# Patient Record
Sex: Male | Born: 1955
Health system: Southern US, Community
[De-identification: ages and names within clinical notes are randomized; demographics above are authoritative.]

## PROBLEM LIST (undated history)

## (undated) DIAGNOSIS — F32A Depression, unspecified: Secondary | ICD-10-CM

## (undated) DIAGNOSIS — I82409 Acute embolism and thrombosis of unspecified deep veins of unspecified lower extremity: Secondary | ICD-10-CM

## (undated) DIAGNOSIS — R7301 Impaired fasting glucose: Secondary | ICD-10-CM

## (undated) DIAGNOSIS — E785 Hyperlipidemia, unspecified: Secondary | ICD-10-CM

## (undated) DIAGNOSIS — B029 Zoster without complications: Secondary | ICD-10-CM

## (undated) DIAGNOSIS — G4700 Insomnia, unspecified: Secondary | ICD-10-CM

## (undated) DIAGNOSIS — K4090 Unilateral inguinal hernia, without obstruction or gangrene, not specified as recurrent: Secondary | ICD-10-CM

## (undated) DIAGNOSIS — N529 Male erectile dysfunction, unspecified: Secondary | ICD-10-CM

## (undated) DIAGNOSIS — I10 Essential (primary) hypertension: Secondary | ICD-10-CM

## (undated) DIAGNOSIS — K219 Gastro-esophageal reflux disease without esophagitis: Secondary | ICD-10-CM

## (undated) DIAGNOSIS — K5790 Diverticulosis of intestine, part unspecified, without perforation or abscess without bleeding: Secondary | ICD-10-CM

## (undated) DIAGNOSIS — F419 Anxiety disorder, unspecified: Secondary | ICD-10-CM

## (undated) DIAGNOSIS — F329 Major depressive disorder, single episode, unspecified: Secondary | ICD-10-CM

## (undated) HISTORY — DX: Depression, unspecified: F32.A

## (undated) HISTORY — DX: Impaired fasting glucose: R73.01

## (undated) HISTORY — DX: Zoster without complications: B02.9

## (undated) HISTORY — DX: Gastro-esophageal reflux disease without esophagitis: K21.9

## (undated) HISTORY — DX: Anxiety disorder, unspecified: F41.9

## (undated) HISTORY — DX: Major depressive disorder, single episode, unspecified: F32.9

## (undated) HISTORY — DX: Hyperlipidemia, unspecified: E78.5

## (undated) HISTORY — DX: Unilateral inguinal hernia, without obstruction or gangrene, not specified as recurrent: K40.90

## (undated) HISTORY — DX: Acute embolism and thrombosis of unspecified deep veins of unspecified lower extremity: I82.409

## (undated) HISTORY — DX: Insomnia, unspecified: G47.00

## (undated) HISTORY — DX: Diverticulosis of intestine, part unspecified, without perforation or abscess without bleeding: K57.90

## (undated) HISTORY — DX: Essential (primary) hypertension: I10

## (undated) HISTORY — DX: Male erectile dysfunction, unspecified: N52.9

---
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
## 1993-01-09 HISTORY — PX: ROTATOR CUFF REPAIR: SHX139

## 1995-01-10 DIAGNOSIS — I82409 Acute embolism and thrombosis of unspecified deep veins of unspecified lower extremity: Secondary | ICD-10-CM

## 1995-01-10 HISTORY — DX: Acute embolism and thrombosis of unspecified deep veins of unspecified lower extremity: I82.409

## 1995-01-10 HISTORY — PX: OTHER SURGICAL HISTORY: SHX169

## 2004-01-10 HISTORY — PX: HERNIA REPAIR: SHX51

## 2004-01-10 HISTORY — PX: COLONOSCOPY: SHX174

## 2006-01-09 HISTORY — PX: VASECTOMY: SHX75

## 2010-06-16 ENCOUNTER — Encounter: Payer: Self-pay | Admitting: Family Medicine

## 2010-06-23 ENCOUNTER — Ambulatory Visit: Payer: Self-pay | Admitting: Family Medicine

## 2010-07-01 ENCOUNTER — Ambulatory Visit: Payer: Self-pay | Admitting: Family Medicine

## 2010-07-04 ENCOUNTER — Encounter: Payer: Self-pay | Admitting: Family Medicine

## 2010-07-04 ENCOUNTER — Ambulatory Visit (INDEPENDENT_AMBULATORY_CARE_PROVIDER_SITE_OTHER): Payer: BC Managed Care – PPO | Admitting: Family Medicine

## 2010-07-04 DIAGNOSIS — Z7969 Long term (current) use of other immunomodulators and immunosuppressants: Secondary | ICD-10-CM | POA: Insufficient documentation

## 2010-07-04 DIAGNOSIS — Z125 Encounter for screening for malignant neoplasm of prostate: Secondary | ICD-10-CM

## 2010-07-04 DIAGNOSIS — E785 Hyperlipidemia, unspecified: Secondary | ICD-10-CM | POA: Insufficient documentation

## 2010-07-04 DIAGNOSIS — Z79899 Other long term (current) drug therapy: Secondary | ICD-10-CM

## 2010-07-04 DIAGNOSIS — I10 Essential (primary) hypertension: Secondary | ICD-10-CM

## 2010-07-04 LAB — COMPREHENSIVE METABOLIC PANEL
AST: 25 U/L (ref 0–37)
Alkaline Phosphatase: 69 U/L (ref 39–117)
BUN: 18 mg/dL (ref 6–23)
Calcium: 9.5 mg/dL (ref 8.4–10.5)
Chloride: 103 mEq/L (ref 96–112)
Creat: 1.01 mg/dL (ref 0.50–1.35)

## 2010-07-04 LAB — LIPID PANEL
HDL: 36 mg/dL — ABNORMAL LOW (ref >39)
Total CHOL/HDL Ratio: 5.4 Ratio
Triglycerides: 140 mg/dL (ref <150)

## 2010-07-04 LAB — PSA: PSA: 0.64 ng/mL (ref <=4.00)

## 2010-07-04 MED ORDER — SIMVASTATIN 20 MG PO TABS: 20.0000 mg | ORAL_TABLET | Freq: Every day | ORAL | Status: DC

## 2010-07-04 MED ORDER — ENALAPRIL MALEATE 20 MG PO TABS: 20.0000 mg | ORAL_TABLET | Freq: Every day | ORAL | Status: DC

## 2010-07-04 MED ORDER — ATOVAQUONE-PROGUANIL HCL 250-100 MG PO TABS: 1.0000 | ORAL_TABLET | Freq: Every day | ORAL | Status: DC

## 2010-07-04 MED ORDER — ESOMEPRAZOLE MAGNESIUM 40 MG PO CPDR: 40.0000 mg | DELAYED_RELEASE_CAPSULE | Freq: Every day | ORAL | Status: DC

## 2010-07-04 NOTE — Assessment & Plan Note (Addendum)
Last lipid panel 05/2009: LDL 123, HDL 43, trig 258, tchol 218). Will check this today, continue statin as current or titrate as appropriate based on lab results today.  CMET today as well.

## 2010-07-04 NOTE — Assessment & Plan Note (Signed)
PSA today (last PSA 05/2009 was 0.53).

## 2010-07-04 NOTE — Progress Notes (Signed)
Office Note 07/04/2010  CC:  Chief Complaint  Patient presents with  . Establish Care    new patient    HPI:  Kyle Bryant is a 55 y.o. White male who is here to establish care. Patient's most recent primary MD: Henri Medal, MD. Old records were reviewed prior to or during today's visit.  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  However, a nurse in [place] recently told him the malaria there is a different strain and she recommended atovaquone-proguanil daily for prophylaxis.  He asks what my recommendation is.  Says BP has been under good control lately, esp after his MD in [place] increased his enalapril from 10mg  to 20mg  daily. Last labs 05/2009 showed triglycerides in the 250s, otherwise lipid panel and BMET normal, PSA normal. He does want labs today and is fasting.  Past Medical History  Diagnosis Date  . Hyperlipidemia   . GERD (gastroesophageal reflux disease)   . Anxiety   . Hypertension   . Shingles     ?    Past Surgical History  Procedure Date  . Rotator cuff repair 1995    right shoulder  . Torn menincus 1997    left knee  . Hernia repair 2006    umbillical  . Vasectomy 2008    Family History  Problem Relation Age of Onset  . Hypertension Father   . Cancer Father   . Diabetes Father     type 2  . Birth defects Sister   . Cancer Sister     breast- survivor  . Other Sister     blood clot  . Diabetes Brother     type 2    History   Social History  . Marital Status: Married    Spouse Name: N/A    Number of Children: N/A  . Years of Education: N/A   Occupational History  . Not on file.   Social History Main Topics  . Smoking status: Former Smoker -- 2.0 packs/day    Types:  Cigarettes, Pipe, Cigars    Quit date: 01/09/1990  . Smokeless tobacco: Former Neurosurgeon    Types: Snuff, Chew    Quit date: 01/09/1978  . Alcohol Use: Yes     1 pint weekly  . Drug Use: No  . Sexually Active: Yes -- Male partner(s)   Other Topics Concern  . Not on file   Social History Narrative   Married, daughter in college.  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  Walks 5[place] one day per week.Occasional drink of ETOH.    Outpatient Encounter Prescriptions as of 07/04/2010  Medication Sig Dispense Refill  . aspirin 81 MG tablet Take 81 mg by mouth daily.        Marland Kitchen esomeprazole (NEXIUM) 40 MG capsule Take 1 capsule (40 mg total) by mouth daily before breakfast.  90 capsule  2  . simvastatin (ZOCOR) 20 MG tablet Take 1 tablet (20 mg total) by mouth at bedtime.  90 tablet  2  . DISCONTD: ALLOPURINOL PO Take 20 mg by mouth daily.        Marland Kitchen DISCONTD: esomeprazole (NEXIUM) 40 MG capsule Take 40 mg by mouth daily before breakfast.        .  DISCONTD: simvastatin (ZOCOR) 20 MG tablet Take 20 mg by mouth at bedtime.        Marland Kitchen atovaquone-proguanil (MALARONE) 250-100 MG TABS Take 1 tablet by mouth daily.  90 tablet  2  . enalapril (VASOTEC) 20 MG tablet Take 1 tablet (20 mg total) by mouth daily.  90 tablet  2    No Known Allergies  ROS Review of Systems  Constitutional: Negative for fever, chills, appetite change and fatigue.  HENT: Negative for ear pain, congestion, sore throat, neck stiffness and dental problem.   Eyes: Negative for discharge, redness and visual disturbance.  Respiratory: Negative for cough, chest tightness, shortness of breath and wheezing.   Cardiovascular: Negative for chest pain, palpitations and leg swelling.  Gastrointestinal: Negative for nausea, vomiting, abdominal pain, diarrhea and blood in stool.  Genitourinary: Negative for dysuria, urgency, frequency, hematuria, flank pain and difficulty urinating.    Musculoskeletal: Positive for arthralgias (left shoulder pain--says it feels like the right shoulder did when he had rotator cuff problems.). Negative for myalgias, back pain and joint swelling.  Skin: Negative for pallor and rash.  Neurological: Negative for dizziness, speech difficulty, weakness and headaches.  Hematological: Negative for adenopathy. Does not bruise/bleed easily.  Psychiatric/Behavioral: Negative for confusion and sleep disturbance. The patient is not nervous/anxious.     PE; Blood pressure 176/98, pulse 77, temperature 98.1 F (36.7 C), temperature source Oral, height 5' 6.25" (1.683 m), weight 256 lb (116.121 kg), SpO2 96.00%. Gen: Alert, well appearing.  Patient is oriented to person, place, time, and situation. HEENT: Scalp without lesions or hair loss.  Ears: EACs clear, normal epithelium.  TMs with good light reflex and landmarks bilaterally.  Eyes: no injection, icteris, swelling, or exudate.  EOMI, PERRLA. Nose: no drainage or turbinate edema/swelling.  No injection or focal lesion.  Mouth: lips without lesion/swelling.  Oral mucosa pink and moist.  Dentition intact and without obvious caries or gingival swelling.  Oropharynx without erythema, exudate, or swelling.  Neck: supple, ROM full.  Carotids 2+ bilat, without bruit.  No lymphadenopathy, thyromegaly, or mass. Chest: symmetric expansion, nonlabored respirations.  Clear and equal breath sounds in all lung fields.   CV: RRR, no m/r/g.  Peripheral pulses 2+ and symmetric. ABD: soft, NT, ND, BS normal.  No hepatospenomegaly or mass.  No bruits. EXT: no clubbing, cyanosis, or edema.   Pertinent labs:  none  ASSESSMENT AND PLAN:  New pt today:  Chemoprophylaxis Recommended atovaquone-proguanil 250/100, 1 qd starting now (he leaves tomorrow for [place]) and continue for 1 week after return.   HTN (hypertension), benign BP up today, but pt admits to being very keyed up about his upcoming trip.  Control has been  good up to today per his report. Continue enalapril 20mg  qd.  Check lytes/cr today.  Hyperlipidemia Last lipid panel 05/2009: LDL 123, HDL 43, trig 258, tchol 218). Will check this today, continue statin as current or titrate as appropriate based on lab results today.  CMET today as well.  Prostate cancer screening PSA today (last PSA 05/2009 was 0.53).       Return in about 6 months (around 01/03/2011) for f/u HTN.

## 2010-07-04 NOTE — Assessment & Plan Note (Signed)
Recommended atovaquone-proguanil 250/100, 1 qd starting now (he leaves tomorrow for [place]) and continue for 1 week after return.

## 2010-07-04 NOTE — Assessment & Plan Note (Signed)
BP up today, but pt admits to being very keyed up about his upcoming trip.  Control has been good up to today per his report. Continue enalapril 20mg  qd.  Check lytes/cr today.

## 2010-07-05 ENCOUNTER — Telehealth: Payer: Self-pay | Admitting: Family Medicine

## 2010-07-05 NOTE — Telephone Encounter (Signed)
Attempted to call patient at all 3 of the numbers listed in his demographics.  All 3 numbers either disconnected or "cannot be connected as dialed". I was going to tell him that all his labs were normal and he should continue current dosing of all meds.  I was also going to let him know that I talked to an MD at the [facility], [facility], [facility] about taking malaria prophylaxis long term and he said that there is absolutely NO LIMIT to the duration recommended for taking prophylaxis meds for malaria.  As long as he is in a country where prophylaxis is recommended, he should take the recommended med, even if that is "years and years".

## 2010-07-05 NOTE — Telephone Encounter (Signed)
Called number given in DPR and spoke to pt regarding labs and generic Malarone.  Also spoke to pt regarding Nexium needing prior authorization.  Pt states he has taken pantoprazole, lansoprazole and omeprazole and failed all of the above.  PA for Nexium started.

## 2010-12-21 ENCOUNTER — Ambulatory Visit (INDEPENDENT_AMBULATORY_CARE_PROVIDER_SITE_OTHER): Payer: BC Managed Care – PPO | Admitting: Family Medicine

## 2010-12-21 ENCOUNTER — Encounter: Payer: Self-pay | Admitting: Family Medicine

## 2010-12-21 DIAGNOSIS — E785 Hyperlipidemia, unspecified: Secondary | ICD-10-CM

## 2010-12-21 DIAGNOSIS — I10 Essential (primary) hypertension: Secondary | ICD-10-CM

## 2010-12-21 NOTE — Progress Notes (Signed)
OFFICE NOTE  12/21/2010  CC:  Chief Complaint  Patient presents with  . Hypertension    6 month follow up     HPI:   Patient is a 55 y.o. Caucasian male who is here for 90mo f/u HTN and hyperlipidemia. No acute complaints. Had ILI about 2 wks ago, then got flu vaccine. He is compliant with meds but doesn't watch his diet at all and struggles to exercise even a marginal amount. 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 ROS: occ hips, knees, ankles, hands pain--usually random, lasts 1/2 day to 1 day, no redness or swelling of joints. Describes mild intermittent CTS in L>R hands--has tried wrist splints in the past and then just decided to "deal with it".  Pertinent PMH:  Past Medical History  Diagnosis Date  . Hyperlipidemia   . GERD (gastroesophageal reflux disease)   . Anxiety   . Hypertension   . Shingles     ?   Past surgical, family and social history reviewed and there are no changes since the patient's last office visit with me.  MEDS;   Outpatient Prescriptions Prior to Visit  Medication Sig Dispense Refill  . aspirin 81 MG tablet Take 81 mg by mouth daily.        . enalapril (VASOTEC) 20 MG tablet Take 1 tablet (20 mg total) by mouth daily.  90 tablet  2  . esomeprazole (NEXIUM) 40 MG capsule Take 1 capsule (40 mg total) by mouth daily before breakfast.  90 capsule  2  . simvastatin (ZOCOR) 20 MG tablet Take 1 tablet (20 mg total) by mouth at bedtime.  90 tablet  2  . atovaquone-proguanil (MALARONE) 250-100 MG TABS Take 1 tablet by mouth daily.  90 tablet  2   ROS: as per HPI  PE: Blood pressure 118/76, pulse 80, height 5' 6.25" (1.683 m), weight 268 lb (121.564 kg). Repeat bp after in exam room 15 min was 118/76.    Gen: Alert, well appearing, obese-appearing.  Patient is oriented to person, place, time, and situation.  Pleasant affect.   ENT: PERRLA, EOMI, no icterus Neck - No masses or thyromegaly or limitation in range of motion CV: RRR, no  m/r/g.   LUNGS: CTA bilat, nonlabored resps, good aeration in all lung fields. Musculoskeletal: no joint swelling, erythema, warmth, or tenderness.  ROM of all joints intact.   IMPRESSION AND PLAN:  HTN (hypertension), benign Problem stable.  Continue current medications and diet appropriate for this condition.  We have reviewed our general long term plan for this problem and also reviewed symptoms and signs that should prompt the patient to call or return to the office. cmet today.  Hyperlipidemia Routine cholesterol panel 90mo ago was basically at goal.  He is not fasting today.  We discussed HDL labs today and he is agreeable to getting these done today.       FOLLOW UP:  No Follow-up on file.

## 2010-12-21 NOTE — Assessment & Plan Note (Signed)
Problem stable.  Continue current medications and diet appropriate for this condition.  We have reviewed our general long term plan for this problem and also reviewed symptoms and signs that should prompt the patient to call or return to the office. cmet today.

## 2010-12-21 NOTE — Assessment & Plan Note (Signed)
Routine cholesterol panel 33mo ago was basically at goal.  He is not fasting today.  We discussed HDL labs today and he is agreeable to getting these done today.

## 2011-01-04 ENCOUNTER — Encounter: Payer: Self-pay | Admitting: Family Medicine

## 2011-01-06 ENCOUNTER — Telehealth: Payer: Self-pay | Admitting: Family Medicine

## 2011-01-06 NOTE — Telephone Encounter (Signed)
Pls notify pt that his advanced lipid testing did show that he has some excess cardiovascular risk.  Before I change anything, though, I really need him to come back and let me repeat one of the tests b/c it was really elevated and I want to verify this.  (his hs-CRP was 12.2 and 3 is th upper limit of normal).  Thx--PM

## 2011-01-11 NOTE — Telephone Encounter (Signed)
Pt agreeable with plan.  He has been/is out of town.  Appt scheduled for Monday.  Please enter order.

## 2011-01-12 ENCOUNTER — Other Ambulatory Visit: Payer: Self-pay | Admitting: Family Medicine

## 2011-01-12 DIAGNOSIS — R7982 Elevated C-reactive protein (CRP): Secondary | ICD-10-CM

## 2011-01-12 NOTE — Telephone Encounter (Signed)
crp ordered--PM

## 2011-01-16 ENCOUNTER — Other Ambulatory Visit (INDEPENDENT_AMBULATORY_CARE_PROVIDER_SITE_OTHER): Payer: BC Managed Care – PPO

## 2011-01-16 DIAGNOSIS — R7982 Elevated C-reactive protein (CRP): Secondary | ICD-10-CM

## 2011-01-16 LAB — HIGH SENSITIVITY CRP: CRP, High Sensitivity: 2.51 mg/L (ref 0.000–5.000)

## 2011-01-17 ENCOUNTER — Encounter: Payer: Self-pay | Admitting: Family Medicine

## 2011-01-17 ENCOUNTER — Other Ambulatory Visit: Payer: Self-pay | Admitting: Family Medicine

## 2011-01-17 MED ORDER — SIMVASTATIN 40 MG PO TABS: 40.0000 mg | ORAL_TABLET | Freq: Every day | ORAL | Status: DC

## 2011-01-30 ENCOUNTER — Encounter: Payer: Self-pay | Admitting: Family Medicine

## 2011-02-06 ENCOUNTER — Ambulatory Visit: Payer: BC Managed Care – PPO | Admitting: Family Medicine

## 2011-02-09 ENCOUNTER — Ambulatory Visit (INDEPENDENT_AMBULATORY_CARE_PROVIDER_SITE_OTHER): Payer: BC Managed Care – PPO | Admitting: Family Medicine

## 2011-02-09 ENCOUNTER — Encounter: Payer: Self-pay | Admitting: Family Medicine

## 2011-02-09 VITALS — BP 143/77 | HR 76 | Temp 97.0°F | Ht 66.25 in | Wt 272.0 lb

## 2011-02-09 DIAGNOSIS — E785 Hyperlipidemia, unspecified: Secondary | ICD-10-CM

## 2011-02-09 NOTE — Progress Notes (Signed)
OFFICE NOTE  02/09/2011  CC:  Chief Complaint  Patient presents with  . Hyperlipidemia    discuss HDL labs     HPI: Patient is a 56 y.o. Caucasian male who is here for discussion of his hyperlipidemia, treatments, labs, etc. He feels well and denies any acute physical complaints. We spent 15 min discussing the results of his HDL labs that were done approximately 6 wks ago.  After these results returned, I advised him to stop his fish oil, increase exercise and increase his zocor to 40mg  qd.   Pertinent PMH:  HTN Hyperlipidemia Obesity  MEDS:  Outpatient Prescriptions Prior to Visit  Medication Sig Dispense Refill  . Ascorbic Acid (VITAMIN C) 1000 MG tablet Take 1,000 mg by mouth daily.        Marland Kitchen aspirin 81 MG tablet Take 81 mg by mouth daily.        . enalapril (VASOTEC) 20 MG tablet Take 1 tablet (20 mg total) by mouth daily.  90 tablet  2  . esomeprazole (NEXIUM) 40 MG capsule Take 1 capsule (40 mg total) by mouth daily before breakfast.  90 capsule  2  . Multiple Vitamin (MULTIVITAMIN) tablet Take 1 tablet by mouth daily.        . simvastatin (ZOCOR) 40 MG tablet Take 1 tablet (40 mg total) by mouth at bedtime.  90 tablet  0    PE: Blood pressure 143/77, pulse 76, temperature 97 F (36.1 C), temperature source Temporal, height 5' 6.25" (1.683 m), weight 272 lb (123.378 kg). Gen: Alert, well appearing.  Patient is oriented to person, place, time, and situation. Pleasant affect, lucid thought and speech.  No further exam today.  IMPRESSION AND PLAN: Hyperlipidemia: we reviewed his advanced lipid testing from 12/2010 and his cholesterol panel from 06/2010. We went over the reasoning behind my recent recommendations to stop fish oil and increase zocor. He expressed understanding and asked questions. I spent 15 min with pt today, with >50% of this time spent counseling regarding high cholesterol and it's associated CV risk, importance of diet, maintenance of statin regimen,  periodic follow up testing, possible need for additional meds in the future, etc.   Plan is to recheck fasting lipids ASAP (he has been 6 wks on the new regimen now), with goal LDL being <100 and goal HDL being >50, goal trigs <161.  Will also repeat hsCRP, which had been very elevated around the time of an ILI, and then retesting later when well was intermediate range for CV risk cut-off (about 2.5).  FOLLOW UP: 68mo

## 2011-02-14 ENCOUNTER — Other Ambulatory Visit (INDEPENDENT_AMBULATORY_CARE_PROVIDER_SITE_OTHER): Payer: BC Managed Care – PPO

## 2011-02-14 DIAGNOSIS — E785 Hyperlipidemia, unspecified: Secondary | ICD-10-CM

## 2011-02-14 LAB — LIPID PANEL
HDL: 35 mg/dL — ABNORMAL LOW (ref >39.00)
Total CHOL/HDL Ratio: 5
Triglycerides: 109 mg/dL (ref 0.0–149.0)

## 2011-02-17 ENCOUNTER — Other Ambulatory Visit: Payer: Self-pay | Admitting: Family Medicine

## 2011-02-17 MED ORDER — NIACIN ER (ANTIHYPERLIPIDEMIC) 500 MG PO TBCR: 500.0000 mg | EXTENDED_RELEASE_TABLET | Freq: Every day | ORAL | Status: DC

## 2011-02-17 NOTE — Progress Notes (Signed)
RX sent as per last lab result note.

## 2011-03-28 ENCOUNTER — Other Ambulatory Visit: Payer: Self-pay | Admitting: *Deleted

## 2011-03-28 MED ORDER — ESOMEPRAZOLE MAGNESIUM 40 MG PO CPDR: 40.0000 mg | DELAYED_RELEASE_CAPSULE | Freq: Every day | ORAL | Status: DC

## 2011-03-28 NOTE — Telephone Encounter (Signed)
eScribe request for refill on nexium Last seen on 12/21/10 Follow up needed June 2013 Last filled 07/04/10, 90 x2 RX sent

## 2011-03-30 ENCOUNTER — Other Ambulatory Visit: Payer: Self-pay | Admitting: *Deleted

## 2011-03-30 MED ORDER — SIMVASTATIN 40 MG PO TABS: 40.0000 mg | ORAL_TABLET | Freq: Every day | ORAL | Status: DC

## 2011-03-30 MED ORDER — ENALAPRIL MALEATE 20 MG PO TABS: 20.0000 mg | ORAL_TABLET | Freq: Every day | ORAL | Status: DC

## 2011-03-30 MED ORDER — ESOMEPRAZOLE MAGNESIUM 40 MG PO CPDR: 40.0000 mg | DELAYED_RELEASE_CAPSULE | Freq: Every day | ORAL | Status: DC

## 2011-03-30 NOTE — Telephone Encounter (Signed)
PC from [place], stating pt will be going out of the country and will need 180 day supply of enalapril, nexium and simvastatin.  180 day supply sent.  Pt will need follow up OV when he returns.

## 2011-04-03 ENCOUNTER — Telehealth: Payer: Self-pay | Admitting: *Deleted

## 2011-04-03 MED ORDER — ENALAPRIL MALEATE 20 MG PO TABS: 20.0000 mg | ORAL_TABLET | Freq: Every day | ORAL | Status: DC

## 2011-04-03 MED ORDER — SIMVASTATIN 40 MG PO TABS: 40.0000 mg | ORAL_TABLET | Freq: Every day | ORAL | Status: DC

## 2011-04-03 MED ORDER — ESOMEPRAZOLE MAGNESIUM 40 MG PO CPDR: 40.0000 mg | DELAYED_RELEASE_CAPSULE | Freq: Every day | ORAL | Status: DC

## 2011-04-03 NOTE — Telephone Encounter (Signed)
Insurance will not cover 180 day.  They will only cover 90 day.  90 day supply sent to pharmacy.  All other scripts with 30 or 90 day quantities are cancelled per [facility] at [facility].

## 2011-06-22 ENCOUNTER — Ambulatory Visit: Payer: BC Managed Care – PPO | Admitting: Family Medicine

## 2011-07-20 ENCOUNTER — Ambulatory Visit (INDEPENDENT_AMBULATORY_CARE_PROVIDER_SITE_OTHER): Payer: Managed Care, Other (non HMO) | Admitting: Family Medicine

## 2011-07-20 ENCOUNTER — Encounter: Payer: Self-pay | Admitting: Family Medicine

## 2011-07-20 VITALS — BP 156/83 | HR 71 | Ht 66.25 in | Wt 258.0 lb

## 2011-07-20 DIAGNOSIS — E785 Hyperlipidemia, unspecified: Secondary | ICD-10-CM

## 2011-07-20 DIAGNOSIS — I10 Essential (primary) hypertension: Secondary | ICD-10-CM

## 2011-07-20 DIAGNOSIS — G4700 Insomnia, unspecified: Secondary | ICD-10-CM

## 2011-07-20 MED ORDER — ZOLPIDEM TARTRATE 10 MG PO TABS: 10.0000 mg | ORAL_TABLET | Freq: Every evening | ORAL | Status: DC | PRN

## 2011-07-20 MED ORDER — ENALAPRIL MALEATE 20 MG PO TABS: 20.0000 mg | ORAL_TABLET | Freq: Every day | ORAL | Status: DC

## 2011-07-20 MED ORDER — ATORVASTATIN CALCIUM 10 MG PO TABS: 10.0000 mg | ORAL_TABLET | Freq: Every day | ORAL | Status: DC

## 2011-07-20 NOTE — Progress Notes (Signed)
OFFICE VISIT  07/20/2011   CC:  Chief Complaint  Patient presents with  . Follow-up    HTN, Hyperlipidemia-not taking meds     HPI:    Patient is a 56 y.o. Caucasian male who presents for 6 mo f/u for HTN and hyperlipidemia. Here between jobs out of country--[place].   Describes very strong FH of hyperlipidemia, says none of them have been on cholesterol med and they all seem to be fine and live long lives.  He has made some good dietary changes and increased his exercise over the last few months (14 lb wt loss).  He stopped his zocor b/c it consistently caused anal leakage from the time he started it.  He recalls no prior cholesterol med. He did not ever start the niaspan I recommended after last lipid/HDL f/u testing.  He wanted to see what I thought about him not taking any meds for cholesterol problem, esp given the above info.  He also describes a long hx of maintenance insomnia.  Describes good sleep hygiene, initiates sleep fine, usually wakes up 3-4 hours later and "my mind won't shut down--thinking about my job, etc".  No depressed mood.  No RLS sx's.  Has tried [place] in the past and it helped on a prn basis, without causing any hangover effect.   Past Medical History  Diagnosis Date  . Hyperlipidemia     HDL labs 12/2010: Apolipoprotein E genotype 3/4, which indicates that taking fish oil supplements may INCREASE his LDL significantly.  Marland Kitchen GERD (gastroesophageal reflux disease)   . Anxiety   . Hypertension   . Shingles     ?    Past Surgical History  Procedure Date  . Rotator cuff repair 1995    right shoulder  . Torn menincus 1997    left knee  . Hernia repair 2006    umbillical  . Vasectomy 2008  . Colonoscopy 2006    [place]    Outpatient Prescriptions Prior to Visit  Medication Sig Dispense Refill  . Ascorbic Acid (VITAMIN C) 1000 MG tablet Take 1,000 mg by mouth daily.        Marland Kitchen aspirin 81 MG  tablet Take 81 mg by mouth daily.        Marland Kitchen esomeprazole (NEXIUM) 40 MG capsule Take 1 capsule (40 mg total) by mouth daily before breakfast.  90 capsule  1  . Multiple Vitamin (MULTIVITAMIN) tablet Take 1 tablet by mouth daily.        . enalapril (VASOTEC) 20 MG tablet Take 1 tablet (20 mg total) by mouth daily.  90 tablet  1  . niacin (NIASPAN) 500 MG CR tablet Take 1 tablet (500 mg total) by mouth at bedtime.  90 tablet  1  . simvastatin (ZOCOR) 40 MG tablet Take 1 tablet (40 mg total) by mouth at bedtime.  90 tablet  1    No Known Allergies  ROS As per HPI  PE: Blood pressure 156/83, pulse 71, height 5' 6.25" (1.683 m), weight 258 lb (117.028 kg). Gen: Alert, well appearing.  Patient is oriented to person, place, time, and situation. No further exam today.  LABS:  None today  IMPRESSION AND PLAN:  Hyperlipidemia Discussed approaches to treatment with him today in detail. It is encouraging to see his lifestyle changes helping him lose wt, and hopefully this will be seen with his lipids as well. However, I did recommend  he stay on statin medication but we'll stay away from zocor due to "anal leakage" side effect.  We'll try atorvastatin 10mg  qd and hopefully it will not cause any similar side effect.  Reassured pt regarding long term use of this type of med, reviewed minimal risk of liver dysfunction or muscle inflammation. He agreed to proceed with this plan and we'll f/u FLP in 75mo when he is back in the country.  HTN (hypertension), benign Last 2 readings here in Stage 1 HTN range, but I emphasized the need for [place] to have bp measurements regularly OUTSIDE of our office. He agreed to buy a bp cuff and keep record of bps and bring these in to next f/u visit.  No med changes made today.  Middle insomnia Encouraged him to continue his efforts at doing good sleep hygiene. Gave rx for ambien 10mg  to take hs prn, #30, RF x 1.  Therapeutic expectations and side effect profile of  medication discussed today.  Patient's questions answered.    Spent 25 min with pt today, with>50% of this time spent in counseling regarding the above problems.  FOLLOW UP: Return for 5-6 mo for f/u hyperlip and HTN (needs to be fasting).

## 2011-07-20 NOTE — Assessment & Plan Note (Signed)
Discussed approaches to treatment with him today in detail. It is encouraging to see his lifestyle changes helping him lose wt, and hopefully this will be seen with his lipids as well. However, I did recommend he stay on statin medication but we'll stay away from zocor due to "anal leakage" side effect.  We'll try atorvastatin 10mg  qd and hopefully it will not cause any similar side effect.  Reassured pt regarding long term use of this type of med, reviewed minimal risk of liver dysfunction or muscle inflammation. He agreed to proceed with this plan and we'll f/u FLP in 47mo when he is back in the country.

## 2011-07-20 NOTE — Assessment & Plan Note (Signed)
Encouraged him to continue his efforts at doing good sleep hygiene. Gave rx for ambien 10mg  to take hs prn, #30, RF x 1.  Therapeutic expectations and side effect profile of medication discussed today.  Patient's questions answered.

## 2011-07-20 NOTE — Assessment & Plan Note (Signed)
Last 2 readings here in Stage 1 HTN range, but I emphasized the need for [place] to have bp measurements regularly OUTSIDE of our office. He agreed to buy a bp cuff and keep record of bps and bring these in to next f/u visit.  No med changes made today.

## 2011-10-23 ENCOUNTER — Other Ambulatory Visit: Payer: Self-pay

## 2011-10-23 MED ORDER — ESOMEPRAZOLE MAGNESIUM 40 MG PO CPDR: 40.0000 mg | DELAYED_RELEASE_CAPSULE | Freq: Every day | ORAL | Status: DC

## 2011-10-23 NOTE — Telephone Encounter (Signed)
RX sent to pharmacy  

## 2011-12-20 ENCOUNTER — Other Ambulatory Visit: Payer: Self-pay | Admitting: Family Medicine

## 2011-12-21 NOTE — Telephone Encounter (Signed)
eScribe request for refill on SIMVASTATIN, ENALAPRIL Last filled - 04/03/11, #90 X 1 Last seen on - 07/20/11 Follow up - 01/15/12  RX sent per protocol

## 2012-01-15 ENCOUNTER — Ambulatory Visit: Payer: Managed Care, Other (non HMO) | Admitting: Family Medicine

## 2012-04-22 ENCOUNTER — Other Ambulatory Visit: Payer: Self-pay | Admitting: Family Medicine

## 2012-04-22 NOTE — Telephone Encounter (Signed)
RX sent on 12/20/11 for #90 x 1.  Per [facility] at pharmacy they have this RX and will fill.  This request denied.

## 2012-06-07 ENCOUNTER — Encounter: Payer: Self-pay | Admitting: Family Medicine

## 2012-06-07 ENCOUNTER — Ambulatory Visit (INDEPENDENT_AMBULATORY_CARE_PROVIDER_SITE_OTHER): Payer: Managed Care, Other (non HMO) | Admitting: Family Medicine

## 2012-06-07 VITALS — BP 126/88 | HR 65 | Temp 98.1°F | Resp 16 | Wt 269.2 lb

## 2012-06-07 DIAGNOSIS — M25519 Pain in unspecified shoulder: Secondary | ICD-10-CM

## 2012-06-07 DIAGNOSIS — I10 Essential (primary) hypertension: Secondary | ICD-10-CM

## 2012-06-07 DIAGNOSIS — G8929 Other chronic pain: Secondary | ICD-10-CM

## 2012-06-07 DIAGNOSIS — E785 Hyperlipidemia, unspecified: Secondary | ICD-10-CM

## 2012-06-07 MED ORDER — ESOMEPRAZOLE MAGNESIUM 40 MG PO CPDR: 40.0000 mg | DELAYED_RELEASE_CAPSULE | Freq: Every day | ORAL | Status: DC

## 2012-06-07 MED ORDER — ZOLPIDEM TARTRATE 10 MG PO TABS: 10.0000 mg | ORAL_TABLET | Freq: Every evening | ORAL | Status: DC | PRN

## 2012-06-07 MED ORDER — ATORVASTATIN CALCIUM 10 MG PO TABS: 10.0000 mg | ORAL_TABLET | Freq: Every day | ORAL | Status: DC

## 2012-06-07 MED ORDER — ENALAPRIL MALEATE 20 MG PO TABS: ORAL_TABLET | ORAL | Status: DC

## 2012-06-07 NOTE — Progress Notes (Signed)
OFFICE NOTE  06/07/2012  CC:  Chief Complaint  Patient presents with  . Follow-up    [HTN; Hyperlipidemia; Chronic shoulder pain]; pt Not fasting.  . Medication Refill    Pt request refills for medications; Rx[s] pending.     HPI: Patient is a 57 y.o. Caucasian male who is here for 10 mo f/u HTN, hyperlipidemia. Has been in [place] since about the last time I saw him, [employer]. Just got back a couple of days ago and right before coming back says he had a bad diarrhea illness.  He was given some "russian meds" and says the last day or two he has pretty much returned to normal. He has been compliant with bp and chol meds except on sick days.    Complains of a few years of on/off left shoulder pain ever since an acute strain injury.  Last 3 mo hurting consistently more/worse.  The pain frequently wakes him up at night (dull/aching pain), worse with movements that impinge the rotator cuff. Says he had a steroid injection in the same shoulder just after his acute injury years ago but nothing since then.  Has hx of rotator cuff tear/repair on right side.  Also complains of years of lower legs being slightly swollen.  Worse at the end of the day and after prolonged standing/inactivity (like the airline flight home recently).  No acute pain.  Says he thinks the left has always been a bit worse than right.  Occ gets calf stiffness and cramping.  No acute calf or thigh pain lately.    Pertinent PMH:  Past Medical History  Diagnosis Date  . Hyperlipidemia     HDL labs 12/2010: Apolipoprotein E genotype 3/4, which indicates that taking fish oil supplements may INCREASE his LDL significantly.  Marland Kitchen GERD (gastroesophageal reflux disease)   . Anxiety   . Hypertension   . Shingles     ?  Marland Kitchen DVT (deep venous thrombosis) 1997    left leg; s/p knee surgery   Past Surgical History  Procedure Laterality Date  . Rotator cuff repair  1995    right shoulder  . Torn menincus  1997     left knee  . Hernia repair  2006    umbillical  . Vasectomy  2008  . Colonoscopy  2006    [place] of [place])   Past family and social history reviewed and there are no changes since the patient's last office visit with me.  MEDS:  Outpatient Prescriptions Prior to Visit  Medication Sig Dispense Refill  . Ascorbic Acid (VITAMIN C) 1000 MG tablet Take 1,000 mg by mouth daily.        Marland Kitchen aspirin 81 MG tablet Take 81 mg by mouth daily.        . Multiple Vitamin (MULTIVITAMIN) tablet Take 1 tablet by mouth daily.        Marland Kitchen atorvastatin (LIPITOR) 10 MG tablet Take 1 tablet (10 mg total) by mouth daily.  90 tablet  2  . enalapril (VASOTEC) 20 MG tablet TAKE 1 TABLET (20 MG TOTAL) BY MOUTH DAILY.  90 tablet  1  . esomeprazole (NEXIUM) 40 MG capsule Take 1 capsule (40 mg total) by mouth daily before breakfast.  90 capsule  0  . simvastatin (ZOCOR) 40 MG tablet TAKE 1 TABLET (40 MG TOTAL) BY MOUTH AT BEDTIME.  90 tablet  1  . zolpidem (AMBIEN) 10 MG tablet Take 1 tablet (10 mg total) by mouth at bedtime  as needed for sleep.  30 tablet  1   No facility-administered medications prior to visit.    PE: Blood pressure 126/88, pulse 65, temperature 98.1 F (36.7 C), temperature source Oral, resp. rate 16, weight 269 lb 4 oz (122.131 kg), SpO2 98.00%. Gen: Alert, well appearing.  Patient is oriented to person, place, time, and situation. CV: RRR, no m/r/g.   LUNGS: CTA bilat, nonlabored resps, good aeration in all lung fields. ABD: soft, NT, ND, BS normal.  No hepatospenomegaly or mass.  No bruits. EXT: trace pitting edema bilat in pretibial regions and ankles, with some slight varicosities scattered throughout lower legs but none are palpable. No calf tenderness, no popliteal fossa tenderness.  Right and left calf circumference each 45 cm measured 10 cm below the patella. No LE rash or skin pigment changes.   No skin flaking.  IMPRESSION AND PLAN:  1) LE venous insufficiency edema; pretty  well controlled with compression stockings that he has on today. Discussed dx in depth, stressed the importance of low Na diet.  Discussed importance of exercise/walking and also periodic elevation of legs above the level of the heart.  2) HTN: stable.  RF'd meds today.  3) Hyperlipidemia: RF'd med today. Return ASAP for FLP and CMET.  4) Chronic left shoulder pain; not worked up today but pt elected for referral to orthopedist--ordered this today.  An After Visit Summary was printed and given to the patient.  FOLLOW UP: 6mo f/u HTN, hyperlipidemia; will try to do DRE/PSA screening at that f/u as well.

## 2012-06-12 ENCOUNTER — Other Ambulatory Visit: Payer: Managed Care, Other (non HMO)

## 2012-06-20 ENCOUNTER — Other Ambulatory Visit: Payer: Self-pay | Admitting: Family Medicine

## 2012-06-20 NOTE — Telephone Encounter (Signed)
Request for [facility] & [facility]itor from [facility]; Last Rx on both 05.30.14 #90x2, verified with Jonny Ruiz at pharmacy that pt has available refills on refills-new request Denied on this basis/SLS

## 2012-12-16 ENCOUNTER — Ambulatory Visit (INDEPENDENT_AMBULATORY_CARE_PROVIDER_SITE_OTHER): Payer: Managed Care, Other (non HMO) | Admitting: Family Medicine

## 2012-12-16 ENCOUNTER — Encounter: Payer: Self-pay | Admitting: Family Medicine

## 2012-12-16 VITALS — BP 155/94 | HR 72 | Temp 98.4°F | Resp 18 | Ht 66.75 in | Wt 252.0 lb

## 2012-12-16 DIAGNOSIS — E785 Hyperlipidemia, unspecified: Secondary | ICD-10-CM

## 2012-12-16 DIAGNOSIS — I10 Essential (primary) hypertension: Secondary | ICD-10-CM

## 2012-12-16 DIAGNOSIS — F329 Major depressive disorder, single episode, unspecified: Secondary | ICD-10-CM

## 2012-12-16 DIAGNOSIS — N529 Male erectile dysfunction, unspecified: Secondary | ICD-10-CM

## 2012-12-16 LAB — MICROALBUMIN / CREATININE URINE RATIO: Creatinine,U: 214.4 mg/dL

## 2012-12-16 MED ORDER — ESCITALOPRAM OXALATE 10 MG PO TABS: 10.0000 mg | ORAL_TABLET | Freq: Every day | ORAL | Status: DC

## 2012-12-16 MED ORDER — SILDENAFIL CITRATE 100 MG PO TABS: ORAL_TABLET | ORAL | Status: DC

## 2012-12-16 NOTE — Progress Notes (Signed)
OFFICE NOTE   Pre visit review using our clinic review tool, if applicable. No additional management support is needed unless otherwise documented below in the visit note.   12/16/2012  CC:  Chief Complaint  Patient presents with  . Follow-up     HPI: Patient is a 57 y.o. Caucasian male who is here for 6 mo f/u hypertension, hyperlipidemia, GERD, and insomnia.  He has been dieting/changed eating habits and has purposefully lost 17 lbs in the last 6 mo.  Patient anxious about wife moving to [place] to live with him.  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  Admits to feeling lonely, depressed, irritable and angry a lot "for years, somewhat, but worse for the last 6 mo or so"  Also c/o erectile dysfunction for 1 yr or so.  Sexual desire intact.    He had intolerable anal leakage that was made worse by stress/work as well as his lipitor. He stopped lipitor and this got better but still he has to clean himself up 3-4 times per day.  Has had URI/cough x 10d and this is gradually improving on mucinex.  Pertinent PMH:  Past Medical History  Diagnosis Date  . Hyperlipidemia     HDL labs 12/2010: Apolipoprotein E genotype 3/4, which indicates that taking fish oil supplements may INCREASE his LDL significantly.  Marland Kitchen GERD (gastroesophageal reflux disease)   . Anxiety   . Hypertension   . Shingles     ?  Marland Kitchen DVT (deep venous thrombosis) 1997    left leg; s/p knee surgery.  Coumadin x 11mo.  . Insomnia    Past Surgical History  Procedure Laterality Date  . Rotator cuff repair  1995    right shoulder  . Torn menincus  1997    left knee  . Hernia repair  2006    umbillical  . Vasectomy  2008  . Colonoscopy  2006    Normal (out of state)   History   Social History Narrative   Married, daughter in college.     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   Lived in [place] prior to [place].   No smoker.  Walks 5mi one day per  week.   Occasional drink of ETOH.          MEDS:  Outpatient Prescriptions Prior to Visit  Medication Sig Dispense Refill  . Ascorbic Acid (VITAMIN C) 1000 MG tablet Take 1,000 mg by mouth daily.        Marland Kitchen aspirin 81 MG tablet Take 81 mg by mouth daily.        . enalapril (VASOTEC) 20 MG tablet TAKE 1 TABLET (20 MG TOTAL) BY MOUTH DAILY.  90 tablet  2  . esomeprazole (NEXIUM) 40 MG capsule Take 1 capsule (40 mg total) by mouth daily before breakfast.  90 capsule  2  . Multiple Vitamin (MULTIVITAMIN) tablet Take 1 tablet by mouth daily.        Marland Kitchen zolpidem (AMBIEN) 10 MG tablet Take 1 tablet (10 mg total) by mouth at bedtime as needed for sleep.  90 tablet  0  . atorvastatin (LIPITOR) 10 MG tablet Take 1 tablet (10 mg total) by mouth daily.  90 tablet  2   No facility-administered medications prior to visit.  **Not taking lipitor as listed above.  PE: Blood pressure 155/94, pulse 72, temperature 98.4 F (36.9 C), temperature source Temporal, resp. rate 18, height 5' 6.75" (1.695 m), weight  252 lb (114.306 kg), SpO2 98.00%. Gen: Alert, well appearing.  Patient is oriented to person, place, time, and situation. ZOX:WRUE: no injection, icteris, swelling, or exudate.  EOMI, PERRLA. Mouth: lips without lesion/swelling.  Oral mucosa pink and moist. Oropharynx without erythema, exudate, or swelling.  CV: RRR, no m/r/g.   LUNGS: CTA bilat, nonlabored resps, good aeration in all lung fields.  IMPRESSION AND PLAN:  Depression, major Start lexapro 10mg  qd.  Therapeutic expectations and side effect profile of medication discussed today.  Patient's questions answered.   Erectile dysfunction Viagra 50mg  samples, 1-2 qd prn, rx given as well.  Therapeutic expectations and side effect profile of medication discussed today.  Patient's questions answered.   HTN (hypertension), benign Check urine microalbumin/cr today. Continue vasotec 20mg  qd. Aspirin 81mg  qd.  Hyperlipidemia Lab Results   Component Value Date   CHOL 160 02/14/2011   HDL 35.00* 02/14/2011   LDLCALC 103* 02/14/2011   TRIG 109.0 02/14/2011   CHOLHDL 5 02/14/2011   Continue diet/exercise.   FOLLOW UP: 05/2013 for CPE

## 2012-12-23 ENCOUNTER — Encounter: Payer: Self-pay | Admitting: Family Medicine

## 2012-12-23 DIAGNOSIS — F329 Major depressive disorder, single episode, unspecified: Secondary | ICD-10-CM | POA: Insufficient documentation

## 2012-12-23 DIAGNOSIS — N529 Male erectile dysfunction, unspecified: Secondary | ICD-10-CM | POA: Insufficient documentation

## 2012-12-23 NOTE — Assessment & Plan Note (Signed)
Lab Results  Component Value Date   CHOL 160 02/14/2011   HDL 35.00* 02/14/2011   LDLCALC 103* 02/14/2011   TRIG 109.0 02/14/2011   CHOLHDL 5 02/14/2011   Continue diet/exercise.

## 2012-12-23 NOTE — Assessment & Plan Note (Signed)
Start lexapro 10mg  qd.  Therapeutic expectations and side effect profile of medication discussed today.  Patient's questions answered.

## 2012-12-23 NOTE — Assessment & Plan Note (Addendum)
Check urine microalbumin/cr today. Continue vasotec 20mg  qd. Aspirin 81mg  qd.

## 2012-12-23 NOTE — Assessment & Plan Note (Signed)
Viagra 50mg  samples, 1-2 qd prn, rx given as well.  Therapeutic expectations and side effect profile of medication discussed today.  Patient's questions answered.

## 2013-04-16 ENCOUNTER — Encounter: Payer: Managed Care, Other (non HMO) | Admitting: Family Medicine

## 2013-04-21 ENCOUNTER — Ambulatory Visit (INDEPENDENT_AMBULATORY_CARE_PROVIDER_SITE_OTHER): Payer: Managed Care, Other (non HMO) | Admitting: Family Medicine

## 2013-04-21 ENCOUNTER — Encounter: Payer: Self-pay | Admitting: Family Medicine

## 2013-04-21 VITALS — BP 120/74 | HR 71 | Temp 99.0°F | Resp 18 | Ht 66.75 in | Wt 245.0 lb

## 2013-04-21 DIAGNOSIS — Z0389 Encounter for observation for other suspected diseases and conditions ruled out: Secondary | ICD-10-CM

## 2013-04-21 DIAGNOSIS — Z0000 Encounter for general adult medical examination without abnormal findings: Secondary | ICD-10-CM

## 2013-04-21 LAB — COMPREHENSIVE METABOLIC PANEL
ALT: 24 U/L (ref 0–53)
AST: 21 U/L (ref 0–37)
Albumin: 3.6 g/dL (ref 3.5–5.2)
Alkaline Phosphatase: 67 U/L (ref 39–117)
BILIRUBIN TOTAL: 0.4 mg/dL (ref 0.3–1.2)
BUN: 15 mg/dL (ref 6–23)
CO2: 24 meq/L (ref 19–32)
CREATININE: 1 mg/dL (ref 0.4–1.5)
Calcium: 9.5 mg/dL (ref 8.4–10.5)
Chloride: 105 mEq/L (ref 96–112)
GFR: 80.97 mL/min (ref >60.00)
GLUCOSE: 96 mg/dL (ref 70–99)
Potassium: 4.4 mEq/L (ref 3.5–5.1)
Sodium: 139 mEq/L (ref 135–145)
TOTAL PROTEIN: 6.4 g/dL (ref 6.0–8.3)

## 2013-04-21 LAB — LIPID PANEL
CHOLESTEROL: 250 mg/dL — AB (ref 0–200)
HDL: 40.2 mg/dL (ref >39.00)
LDL CALC: 151 mg/dL — AB (ref 0–99)
TRIGLYCERIDES: 294 mg/dL — AB (ref 0.0–149.0)
Total CHOL/HDL Ratio: 6
VLDL: 58.8 mg/dL — AB (ref 0.0–40.0)

## 2013-04-21 LAB — CBC WITH DIFFERENTIAL/PLATELET
Basophils Absolute: 0 10*3/uL (ref 0.0–0.1)
Basophils Relative: 0.3 % (ref 0.0–3.0)
EOS PCT: 3.1 % (ref 0.0–5.0)
Eosinophils Absolute: 0.2 10*3/uL (ref 0.0–0.7)
HCT: 47.6 % (ref 39.0–52.0)
Hemoglobin: 16.1 g/dL (ref 13.0–17.0)
Lymphocytes Relative: 25.6 % (ref 12.0–46.0)
Lymphs Abs: 1.9 10*3/uL (ref 0.7–4.0)
MCHC: 33.8 g/dL (ref 30.0–36.0)
MCV: 95.9 fl (ref 78.0–100.0)
MONOS PCT: 6.4 % (ref 3.0–12.0)
Monocytes Absolute: 0.5 10*3/uL (ref 0.1–1.0)
NEUTROS PCT: 64.6 % (ref 43.0–77.0)
Neutro Abs: 4.7 10*3/uL (ref 1.4–7.7)
PLATELETS: 231 10*3/uL (ref 150.0–400.0)
RBC: 4.97 Mil/uL (ref 4.22–5.81)
RDW: 12.9 % (ref 11.5–14.6)
WBC: 7.2 10*3/uL (ref 4.5–10.5)

## 2013-04-21 LAB — TSH: TSH: 1.86 u[IU]/mL (ref 0.35–5.50)

## 2013-04-21 LAB — PSA: PSA: 0.75 ng/mL (ref 0.10–4.00)

## 2013-04-21 MED ORDER — ESOMEPRAZOLE MAGNESIUM 40 MG PO CPDR: 40.0000 mg | DELAYED_RELEASE_CAPSULE | Freq: Every day | ORAL | Status: DC

## 2013-04-21 MED ORDER — ZOLPIDEM TARTRATE 10 MG PO TABS: 10.0000 mg | ORAL_TABLET | Freq: Every evening | ORAL | Status: DC | PRN

## 2013-04-21 MED ORDER — SILDENAFIL CITRATE 100 MG PO TABS: ORAL_TABLET | ORAL | Status: DC

## 2013-04-21 MED ORDER — ENALAPRIL MALEATE 20 MG PO TABS: ORAL_TABLET | ORAL | Status: DC

## 2013-04-21 NOTE — Progress Notes (Signed)
Pre visit review using our clinic review tool, if applicable. No additional management support is needed unless otherwise documented below in the visit note. 

## 2013-04-21 NOTE — Progress Notes (Signed)
Office Note 04/21/2013  CC:  Chief Complaint  Patient presents with  . Annual Exam    not fasting    HPI:  Kyle Bryant is a 58 y.o. White male who is here for annual CPE. Says he is feeling well. 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  He has been stationed in [place] now for his work, wife to move over there with him soon.  Compliant with bp med.  No outside bp meds to report.   Past Medical History  Diagnosis Date  . Hyperlipidemia     HDL labs 12/2010: Apolipoprotein E genotype 3/4, which indicates that taking fish oil supplements may INCREASE his LDL significantly.  Lipitor caused excessive anal leakage.  Marland Kitchen GERD (gastroesophageal reflux disease)   . Anxiety   . Hypertension   . Shingles     ?  Marland Kitchen DVT (deep venous thrombosis) 1997    left leg; s/p knee surgery.  Coumadin x 32mo.  . Insomnia   . Depression, major 12/2012  . Erectile dysfunction     Past Surgical History  Procedure Laterality Date  . Rotator cuff repair  1995    right shoulder  . Torn menincus  1997    left knee  . Hernia repair  2006    umbillical  . Vasectomy  2008  . Colonoscopy  2006    Normal (out of state)    Family History  Problem Relation Age of Onset  . Hypertension Father   . Cancer Father   . Diabetes Father     type 2  . Birth defects Sister   . Cancer Sister     breast- survivor  . Other Sister     blood clot  . Diabetes Brother     type 2  . Cancer Father     lymphoma    History   Social History  . Marital Status: Married    Spouse Name: N/A    Number of Children: N/A  . Years of Education: N/A   Occupational History  . Not on file.   Social History Main Topics  . Smoking status: Former Smoker -- 2.00 packs/day    Types: Cigarettes, Pipe, Cigars    Quit date: 01/09/1990  . Smokeless tobacco: Former Neurosurgeon    Types: Snuff,  Chew    Quit date: 01/09/1978  . Alcohol Use: Yes     Comment: 1 pint weekly  . Drug Use: No  . Sexual Activity: Yes    Partners: Female   Other Topics Concern  . Not on file   Social History Narrative   Married, daughter in college.     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   Lived in [place] prior to [place].   No smoker.  Walks 5mi one day per week.   Occasional drink of ETOH.         MEDS: not taking lexapro listed below. Outpatient Prescriptions Prior to Visit  Medication Sig Dispense Refill  . Ascorbic Acid (VITAMIN C) 1000 MG tablet Take 1,000 mg by mouth daily.        Marland Kitchen aspirin 81 MG tablet Take 81 mg by mouth daily.        . Multiple Vitamin (MULTIVITAMIN) tablet Take 1 tablet by mouth daily.        . enalapril (VASOTEC) 20  MG tablet TAKE 1 TABLET (20 MG TOTAL) BY MOUTH DAILY.  90 tablet  2  . esomeprazole (NEXIUM) 40 MG capsule Take 1 capsule (40 mg total) by mouth daily before breakfast.  90 capsule  2  . zolpidem (AMBIEN) 10 MG tablet Take 1 tablet (10 mg total) by mouth at bedtime as needed for sleep.  90 tablet  0  . escitalopram (LEXAPRO) 10 MG tablet Take 1 tablet (10 mg total) by mouth daily.  30 tablet  6  . sildenafil (VIAGRA) 100 MG tablet 1/2-1 tab po qd prn  9 tablet  0   No facility-administered medications prior to visit.    No Known Allergies  ROS Review of Systems  Constitutional: Negative for fever, chills, appetite change and fatigue.  HENT: Negative for congestion, dental problem, ear pain and sore throat.   Eyes: Negative for discharge, redness and visual disturbance.  Respiratory: Negative for cough, chest tightness, shortness of breath and wheezing.   Cardiovascular: Negative for chest pain, palpitations and leg swelling.  Gastrointestinal: Negative for nausea, vomiting, abdominal pain, diarrhea and blood in stool.  Genitourinary: Negative for dysuria, urgency, frequency, hematuria, flank pain and difficulty urinating.   Musculoskeletal: Negative for arthralgias, back pain, joint swelling, myalgias and neck stiffness.  Skin: Negative for pallor and rash.  Neurological: Negative for dizziness, speech difficulty, weakness and headaches.  Hematological: Negative for adenopathy. Does not bruise/bleed easily.  Psychiatric/Behavioral: Negative for confusion and sleep disturbance. The patient is not nervous/anxious.     PE; Blood pressure 120/74, pulse 71, temperature 99 F (37.2 C), temperature source Temporal, resp. rate 18, height 5' 6.75" (1.695 m), weight 245 lb (111.131 kg), SpO2 96.00%. Gen: Alert, well appearing.  Patient is oriented to person, place, time, and situation. AFFECT: pleasant, lucid thought and speech. ENT: Ears: EACs clear, normal epithelium.  TMs with good light reflex and landmarks bilaterally.  Eyes: no injection, icteris, swelling, or exudate.  EOMI, PERRLA. Nose: no drainage or turbinate edema/swelling.  No injection or focal lesion.  Mouth: lips without lesion/swelling.  Oral mucosa pink and moist.  Dentition intact and without obvious caries or gingival swelling.  Oropharynx without erythema, exudate, or swelling.  Neck: supple/nontender.  No LAD, mass, or TM.  Carotid pulses 2+ bilaterally, without bruits. CV: RRR, no m/r/g.   LUNGS: CTA bilat, nonlabored resps, good aeration in all lung fields. ABD: soft, NT, ND, BS normal.  No hepatospenomegaly or mass.  No bruits. EXT: no clubbing, cyanosis, or edema.  Musculoskeletal: no joint swelling, erythema, warmth, or tenderness.  ROM of all joints intact. Skin - no sores or suspicious lesions or rashes or color changes Neuro: CN 2-12 intact bilaterally, strength 5/5 in proximal and distal upper extremities and lower extremities bilaterally.  No sensory deficits.  No tremor.  No disdiadochokinesis.  No ataxia.  Upper extremity and lower extremity DTRs symmetric.  No pronator drift. Rectal exam: negative without mass, lesions or tenderness,  PROSTATE EXAM: smooth and symmetric without nodules or tenderness.   Pertinent labs:  None today  ASSESSMENT AND PLAN:   Health maintenance examination Reviewed age and gender appropriate health maintenance issues (prudent diet, regular exercise, health risks of tobacco and excessive alcohol, use of seatbelts, fire alarms in home, use of sunscreen).  Also reviewed age and gender appropriate health screening as well as vaccine recommendations. HM labs drawn today. DRE normal today, PSA screening lab drawn today. Continue all current meds.   No vaccines due today.  An After Visit  Summary was printed and given to the patient.  FOLLOW UP:  Return in about 6 months (around 10/21/2013) for routine chronic illness f/u.

## 2013-04-21 NOTE — Assessment & Plan Note (Signed)
Reviewed age and gender appropriate health maintenance issues (prudent diet, regular exercise, health risks of tobacco and excessive alcohol, use of seatbelts, fire alarms in home, use of sunscreen).  Also reviewed age and gender appropriate health screening as well as vaccine recommendations. HM labs drawn today. DRE normal today, PSA screening lab drawn today. Continue all current meds.

## 2013-04-23 ENCOUNTER — Other Ambulatory Visit: Payer: Self-pay | Admitting: Family Medicine

## 2013-04-23 MED ORDER — PRAVASTATIN SODIUM 20 MG PO TABS: 20.0000 mg | ORAL_TABLET | Freq: Every day | ORAL | Status: DC

## 2013-06-07 ENCOUNTER — Other Ambulatory Visit: Payer: Self-pay | Admitting: Family Medicine

## 2013-08-28 ENCOUNTER — Other Ambulatory Visit: Payer: Self-pay | Admitting: Family Medicine

## 2013-08-28 NOTE — Telephone Encounter (Signed)
Last Ov 04/22/2003. Lasr refill 04/21/2013

## 2013-11-24 ENCOUNTER — Other Ambulatory Visit: Payer: Self-pay | Admitting: Family Medicine

## 2013-11-24 ENCOUNTER — Telehealth: Payer: Self-pay

## 2013-11-24 MED ORDER — ESOMEPRAZOLE MAGNESIUM 40 MG PO CPDR: 40.0000 mg | DELAYED_RELEASE_CAPSULE | Freq: Every day | ORAL | Status: DC

## 2013-11-24 NOTE — Telephone Encounter (Signed)
Yes, ok to fill early.-thx

## 2013-11-24 NOTE — Telephone Encounter (Signed)
[facility] called wanting to get a verbal ok to refill pt's ambien early. He states it cannot be filled until the 23rd but pt states he is going out of the country and will not be here on that date. Please advise.

## 2013-11-25 ENCOUNTER — Other Ambulatory Visit: Payer: Self-pay | Admitting: Family Medicine

## 2013-11-25 NOTE — Telephone Encounter (Signed)
Duplicate RX request

## 2013-11-25 NOTE — Telephone Encounter (Signed)
[facility], advised pharmacist it is ok to refill Ambien early.

## 2014-01-12 ENCOUNTER — Ambulatory Visit: Payer: Managed Care, Other (non HMO) | Admitting: Family Medicine

## 2014-02-16 ENCOUNTER — Other Ambulatory Visit: Payer: Self-pay | Admitting: Family Medicine

## 2014-02-16 MED ORDER — ENALAPRIL MALEATE 20 MG PO TABS: ORAL_TABLET | ORAL | Status: DC

## 2014-03-25 ENCOUNTER — Other Ambulatory Visit: Payer: Self-pay | Admitting: Family Medicine

## 2014-03-25 MED ORDER — ENALAPRIL MALEATE 20 MG PO TABS: ORAL_TABLET | ORAL | Status: DC

## 2014-03-25 MED ORDER — PRAVASTATIN SODIUM 20 MG PO TABS: 20.0000 mg | ORAL_TABLET | Freq: Every day | ORAL | Status: DC

## 2014-03-25 MED ORDER — ESOMEPRAZOLE MAGNESIUM 40 MG PO CPDR: 40.0000 mg | DELAYED_RELEASE_CAPSULE | Freq: Every day | ORAL | Status: DC

## 2014-03-25 NOTE — Telephone Encounter (Signed)
Pt's wife requested rfs for pt because he is out of the country and she states that he will be back in June and that he will schedule a CPE at that time.

## 2014-03-27 ENCOUNTER — Telehealth: Payer: Self-pay

## 2014-03-27 ENCOUNTER — Other Ambulatory Visit: Payer: Self-pay | Admitting: *Deleted

## 2014-03-27 MED ORDER — ZOLPIDEM TARTRATE 10 MG PO TABS: 10.0000 mg | ORAL_TABLET | Freq: Every evening | ORAL | Status: DC | PRN

## 2014-03-27 NOTE — Telephone Encounter (Signed)
Patients wife requested for Meds to be refilled and she forgot one. Patient is out of the country right now.  -Ambien -[facility]

## 2014-03-27 NOTE — Telephone Encounter (Signed)
Refill request for zolpidem Last filled by MD on- 08/28/13 #90 x1 Last Appt: 04/21/2013 Next Appt: none Please advise refill?

## 2014-03-31 NOTE — Telephone Encounter (Signed)
This has been done.

## 2014-10-05 ENCOUNTER — Other Ambulatory Visit: Payer: Self-pay | Admitting: *Deleted

## 2014-10-05 MED ORDER — SILDENAFIL CITRATE 100 MG PO TABS: ORAL_TABLET | ORAL | Status: DC

## 2014-10-05 NOTE — Telephone Encounter (Signed)
RF request for viagra LOV: 04/21/13 due for f/u Next ov: None Last written: 04/21/13 #9 w/ 6RF  Spoke with pts wife, okay per DPR. Advised her that pt needs ov, she stated that [place] but will be back end of November beginning of December and will schedule apt then.

## 2014-10-08 ENCOUNTER — Other Ambulatory Visit: Payer: Self-pay | Admitting: *Deleted

## 2014-10-08 MED ORDER — ENALAPRIL MALEATE 20 MG PO TABS: ORAL_TABLET | ORAL | Status: DC

## 2014-10-08 NOTE — Telephone Encounter (Signed)
RF request for enalapril LOV: 04/21/13 Next ov: None Last written: 03/25/14 #90 w/ 1RF  [place] is [place] seas but wife was advised that he will need an ov when he comes back home.

## 2014-12-18 ENCOUNTER — Ambulatory Visit (INDEPENDENT_AMBULATORY_CARE_PROVIDER_SITE_OTHER): Payer: BLUE CROSS/BLUE SHIELD | Admitting: Family Medicine

## 2014-12-18 ENCOUNTER — Encounter: Payer: Self-pay | Admitting: Family Medicine

## 2014-12-18 VITALS — BP 151/96 | HR 66 | Temp 97.9°F | Resp 16 | Ht 66.75 in | Wt 254.0 lb

## 2014-12-18 DIAGNOSIS — Z125 Encounter for screening for malignant neoplasm of prostate: Secondary | ICD-10-CM | POA: Diagnosis not present

## 2014-12-18 DIAGNOSIS — Z23 Encounter for immunization: Secondary | ICD-10-CM

## 2014-12-18 DIAGNOSIS — M545 Low back pain: Secondary | ICD-10-CM

## 2014-12-18 DIAGNOSIS — L309 Dermatitis, unspecified: Secondary | ICD-10-CM

## 2014-12-18 DIAGNOSIS — Z0000 Encounter for general adult medical examination without abnormal findings: Secondary | ICD-10-CM

## 2014-12-18 DIAGNOSIS — Z1211 Encounter for screening for malignant neoplasm of colon: Secondary | ICD-10-CM

## 2014-12-18 DIAGNOSIS — G8929 Other chronic pain: Secondary | ICD-10-CM | POA: Diagnosis not present

## 2014-12-18 LAB — CBC WITH DIFFERENTIAL/PLATELET
Basophils Absolute: 0 10*3/uL (ref 0.0–0.1)
Basophils Relative: 0.5 % (ref 0.0–3.0)
EOS ABS: 0.4 10*3/uL (ref 0.0–0.7)
Eosinophils Relative: 4.9 % (ref 0.0–5.0)
HCT: 50.5 % (ref 39.0–52.0)
Hemoglobin: 16.9 g/dL (ref 13.0–17.0)
Lymphocytes Relative: 31 % (ref 12.0–46.0)
Lymphs Abs: 2.2 10*3/uL (ref 0.7–4.0)
MCHC: 33.4 g/dL (ref 30.0–36.0)
MCV: 93.9 fl (ref 78.0–100.0)
Monocytes Absolute: 0.6 10*3/uL (ref 0.1–1.0)
Monocytes Relative: 8.3 % (ref 3.0–12.0)
NEUTROS ABS: 4 10*3/uL (ref 1.4–7.7)
NEUTROS PCT: 55.3 % (ref 43.0–77.0)
PLATELETS: 275 10*3/uL (ref 150.0–400.0)
RBC: 5.37 Mil/uL (ref 4.22–5.81)
RDW: 13.1 % (ref 11.5–15.5)
WBC: 7.2 10*3/uL (ref 4.0–10.5)

## 2014-12-18 LAB — COMPREHENSIVE METABOLIC PANEL
ALT: 39 U/L (ref 0–53)
AST: 22 U/L (ref 0–37)
Albumin: 4.5 g/dL (ref 3.5–5.2)
Alkaline Phosphatase: 68 U/L (ref 39–117)
BILIRUBIN TOTAL: 0.5 mg/dL (ref 0.2–1.2)
BUN: 15 mg/dL (ref 6–23)
CO2: 30 meq/L (ref 19–32)
Calcium: 10 mg/dL (ref 8.4–10.5)
Chloride: 104 mEq/L (ref 96–112)
Creatinine, Ser: 1.09 mg/dL (ref 0.40–1.50)
GFR: 73.72 mL/min (ref >60.00)
GLUCOSE: 96 mg/dL (ref 70–99)
Potassium: 4.6 mEq/L (ref 3.5–5.1)
Sodium: 142 mEq/L (ref 135–145)
TOTAL PROTEIN: 7.2 g/dL (ref 6.0–8.3)

## 2014-12-18 LAB — LIPID PANEL
CHOL/HDL RATIO: 6
Cholesterol: 270 mg/dL — ABNORMAL HIGH (ref 0–200)
HDL: 46.8 mg/dL (ref >39.00)
LDL Cholesterol: 195 mg/dL — ABNORMAL HIGH (ref 0–99)
NONHDL: 222.94
Triglycerides: 139 mg/dL (ref 0.0–149.0)
VLDL: 27.8 mg/dL (ref 0.0–40.0)

## 2014-12-18 LAB — PSA: PSA: 0.62 ng/mL (ref 0.10–4.00)

## 2014-12-18 LAB — TSH: TSH: 1.73 u[IU]/mL (ref 0.35–4.50)

## 2014-12-18 NOTE — Progress Notes (Signed)
Office Note 12/18/2014  CC:  Chief Complaint  Patient presents with  . Annual Exam    Pt is fasting.    HPI:  Kyle Bryant is a 59 y.o. White male who is here for annual health maintenance exam.  Having gradual onset of more low back pain that feels muscular to him, w/out radiation down legs or any paresthesias.  He has good ROM and stretches back daily but the soreness doesn't seem to be alleviated. Also having some pain in L>R hip region lately, points to anterior hip flexors in groin area as an area of tenderness when he presses firmly/deep.  No hx of back or hip injury.  Back of neck with rash noted by his wife recently, no itching.  Has been wearing wool vest with raised collar last few weeks while living in [place].  [place]approx mid Jan 2017.  No home bp monitoring in the last year or two. Exercise: walking a lot lately. Diet: trying to increase veggies, but also says diet "not that good".  Past Medical History  Diagnosis Date  . Hyperlipidemia     Anal leakage/intolerance to atorva and prava: pt declines further statin trials  . GERD (gastroesophageal reflux disease)   . Anxiety   . Hypertension   . Shingles     ?  Marland Kitchen. DVT (deep venous thrombosis) (HCC) 1997    left leg; s/p knee surgery.  Coumadin x 76mo.  . Insomnia   . Depression, major (HCC) 12/2012  . Erectile dysfunction     Past Surgical History  Procedure Laterality Date  . Rotator cuff repair  1995    right shoulder  . Torn menincus  1997    left knee  . Hernia repair  2006    umbillical  . Vasectomy  2008  . Colonoscopy  2006    [place])    Family History  Problem Relation Age of Onset  . Hypertension Father   . Cancer Father   . Diabetes Father     type 2  . Birth defects Sister   . Cancer Sister     breast- survivor  . Other Sister     blood clot  . Diabetes Brother     type 2  . Cancer Father     lymphoma    Social History   Social History   . Marital Status: Married    Spouse Name: N/A  . Number of Children: N/A  . Years of Education: N/A   Occupational History  . Not on file.   Social History Main Topics  . Smoking status: Former Smoker -- 2.00 packs/day    Types: Cigarettes, Pipe, Cigars    Quit date: 01/09/1990  . Smokeless tobacco: Former NeurosurgeonUser    Types: Snuff, Chew    Quit date: 01/09/1978  . Alcohol Use: Yes     Comment: 1 pint weekly  . Drug Use: No  . Sexual Activity:    Partners: Female   Other Topics Concern  . Not on file   Social History Narrative   Married, daughter in college.     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   [place]   No smoker.  Walks 5mi one day per week.   Occasional drink of ETOH.         Not taking pravachol listed below Outpatient Prescriptions Prior to Visit  Medication Sig Dispense Refill  . Ascorbic Acid (  VITAMIN C) 1000 MG tablet Take 1,000 mg by mouth daily.      Marland Kitchen aspirin 81 MG tablet Take 81 mg by mouth daily.      . enalapril (VASOTEC) 20 MG tablet TAKE 1 TABLET (20 MG TOTAL) BY MOUTH DAILY. 90 tablet 1  . esomeprazole (NEXIUM) 40 MG capsule Take 1 capsule (40 mg total) by mouth daily before breakfast. 90 capsule 1  . Multiple Vitamin (MULTIVITAMIN) tablet Take 1 tablet by mouth daily.      . sildenafil (VIAGRA) 100 MG tablet 1/2-1 tab po qd prn 9 tablet 6  . zolpidem (AMBIEN) 10 MG tablet Take 1 tablet (10 mg total) by mouth at bedtime as needed. for sleep 90 tablet 1  . pravastatin (PRAVACHOL) 20 MG tablet Take 1 tablet (20 mg total) by mouth daily. (Patient not taking: Reported on 12/18/2014) 90 tablet 1   No facility-administered medications prior to visit.    No Known Allergies  ROS Review of Systems  Constitutional: Negative for fever, chills, appetite change and fatigue.  HENT: Negative for congestion, dental problem, ear pain and sore throat.   Eyes: Negative for discharge, redness and visual disturbance.   Respiratory: Negative for cough, chest tightness, shortness of breath and wheezing.   Cardiovascular: Negative for chest pain, palpitations and leg swelling.  Gastrointestinal: Negative for nausea, vomiting, abdominal pain, diarrhea and blood in stool.  Genitourinary: Negative for dysuria, urgency, frequency, hematuria, flank pain and difficulty urinating.  Musculoskeletal: Negative for myalgias, back pain, joint swelling, arthralgias and neck stiffness.       Soreness R anterior tibial area recently, no injury there.  Skin: Negative for pallor and rash.  Neurological: Negative for dizziness, speech difficulty, weakness and headaches.  Hematological: Negative for adenopathy. Does not bruise/bleed easily.  Psychiatric/Behavioral: Negative for confusion and sleep disturbance. The patient is not nervous/anxious.     PE; Blood pressure 151/96, pulse 66, temperature 97.9 F (36.6 C), temperature source Oral, resp. rate 16, height 5' 6.75" (1.695 m), weight 254 lb (115.214 kg), SpO2 97 %. BP recheck today 140/80 Gen: Alert, well appearing.  Patient is oriented to person, place, time, and situation. AFFECT: pleasant, lucid thought and speech. ENT: Ears: EACs clear, normal epithelium.  TMs with good light reflex and landmarks bilaterally.  Eyes: no injection, icteris, swelling, or exudate.  EOMI, PERRLA. Nose: no drainage or turbinate edema/swelling.  No injection or focal lesion.  Mouth: lips without lesion/swelling.  Oral mucosa pink and moist.  Dentition intact and without obvious caries or gingival swelling.  Oropharynx without erythema, exudate, or swelling.  Neck: supple/nontender.  No LAD, mass, or TM.  Carotid pulses 2+ bilaterally, without bruits. CV: RRR, no m/r/g.   LUNGS: CTA bilat, nonlabored resps, good aeration in all lung fields. ABD: soft, NT, ND, BS normal.  No hepatospenomegaly or mass.  No bruits. EXT: no clubbing, cyanosis, or edema.  Musculoskeletal: no joint swelling,  erythema, warmth, or tenderness.  ROM of all joints intact. Skin - no sores or suspicious skin lesions. Diffuse pinkish papular rash on back of neck, occiput, and tops of shoulders, symmetric.  No pustules/vesicles/hives.  No tenderness.    Pertinent labs:  Lab Results  Component Value Date   TSH 1.86 04/21/2013   Lab Results  Component Value Date   WBC 7.2 04/21/2013   HGB 16.1 04/21/2013   HCT 47.6 04/21/2013   MCV 95.9 04/21/2013   PLT 231.0 04/21/2013   Lab Results  Component Value Date  CREATININE 1.0 04/21/2013   BUN 15 04/21/2013   NA 139 04/21/2013   K 4.4 04/21/2013   CL 105 04/21/2013   CO2 24 04/21/2013   Lab Results  Component Value Date   ALT 24 04/21/2013   AST 21 04/21/2013   ALKPHOS 67 04/21/2013   BILITOT 0.4 04/21/2013   Lab Results  Component Value Date   CHOL 250* 04/21/2013   Lab Results  Component Value Date   HDL 40.20 04/21/2013   Lab Results  Component Value Date   LDLCALC 151* 04/21/2013   Lab Results  Component Value Date   TRIG 294.0* 04/21/2013   Lab Results  Component Value Date   CHOLHDL 6 04/21/2013   Lab Results  Component Value Date   PSA 0.75 04/21/2013   PSA 0.64 07/04/2010    ASSESSMENT AND PLAN:   Health maintenance exam: Reviewed age and gender appropriate health maintenance issues (prudent diet, regular exercise, health risks of tobacco and excessive alcohol, use of seatbelts, fire alarms in home, use of sunscreen).  Also reviewed age and gender appropriate health screening as well as vaccine recommendations. Flu vaccine today. Referral to GI for repeat colonoscopy (last was 2006). DRE normal today, PSA drawn. Fasting health panel drawn today.  Regarding his chronic LBP, I feel like he likely has some soft tissue component but also likely some osteoarthritis.  Will check lumbar spine plain films--ordered.  I think his rash on occiput, back of neck, and tops of shoulders is from mild sensitivity rxn to  wearing wool coat lately.  No med recommended since he is asymptomatic and will no longer be wearing the wool coat.  Monitor.  An After Visit Summary was printed and given to the patient.  FOLLOW UP:  Return in about 1 year (around 12/18/2015) for annual CPE (fasting).

## 2014-12-18 NOTE — Progress Notes (Signed)
Pre visit review using our clinic review tool, if applicable. No additional management support is needed unless otherwise documented below in the visit note. 

## 2014-12-24 ENCOUNTER — Ambulatory Visit (AMBULATORY_SURGERY_CENTER): Payer: Self-pay | Admitting: *Deleted

## 2014-12-24 VITALS — Ht 67.0 in | Wt 263.0 lb

## 2014-12-24 DIAGNOSIS — Z1211 Encounter for screening for malignant neoplasm of colon: Secondary | ICD-10-CM

## 2014-12-24 MED ORDER — NA SULFATE-K SULFATE-MG SULF 17.5-3.13-1.6 GM/177ML PO SOLN: 1.0000 | Freq: Once | ORAL | Status: DC

## 2014-12-24 NOTE — Progress Notes (Signed)
No egg or soy allergy known to patient  No issues with past sedation with any surgeries  or procedures, no intubation problems  No diet pills No home 02 use per patient   emmi video to   [email or web address]

## 2014-12-25 ENCOUNTER — Encounter: Payer: Self-pay | Admitting: Internal Medicine

## 2015-01-07 ENCOUNTER — Encounter: Payer: BLUE CROSS/BLUE SHIELD | Admitting: Internal Medicine

## 2015-02-08 ENCOUNTER — Other Ambulatory Visit: Payer: Self-pay | Admitting: *Deleted

## 2015-02-08 MED ORDER — ZOLPIDEM TARTRATE 10 MG PO TABS: 10.0000 mg | ORAL_TABLET | Freq: Every evening | ORAL | Status: DC | PRN

## 2015-02-08 NOTE — Telephone Encounter (Signed)
Pts wife LMOM on 02/08/15 at 2:35pm stating that pt is back overseas and needs a new Rx for his Ambien. [facility]  RF request for Ambien LOV: 12/18/14 Next ov: None Last written: 03/27/14 #90 w/ 1RF  Please advise. Thanks.

## 2015-02-09 NOTE — Telephone Encounter (Signed)
Rx faxed

## 2015-04-16 ENCOUNTER — Other Ambulatory Visit: Payer: Self-pay | Admitting: *Deleted

## 2015-04-16 MED ORDER — ENALAPRIL MALEATE 20 MG PO TABS: ORAL_TABLET | ORAL | Status: DC

## 2015-04-16 NOTE — Telephone Encounter (Signed)
RF request for enalapril LOV: 12/18/14 Next ov: None Last written: 10/05/14 #90 w/ 1RF

## 2016-01-06 ENCOUNTER — Ambulatory Visit (INDEPENDENT_AMBULATORY_CARE_PROVIDER_SITE_OTHER): Payer: BLUE CROSS/BLUE SHIELD | Admitting: Family Medicine

## 2016-01-06 ENCOUNTER — Encounter: Payer: Self-pay | Admitting: Family Medicine

## 2016-01-06 ENCOUNTER — Ambulatory Visit (HOSPITAL_BASED_OUTPATIENT_CLINIC_OR_DEPARTMENT_OTHER)
Admission: RE | Admit: 2016-01-06 | Discharge: 2016-01-06 | Disposition: A | Discharge status: Home / Self Care | Payer: BLUE CROSS/BLUE SHIELD | Source: Ambulatory Visit | Attending: Family Medicine | Admitting: Family Medicine

## 2016-01-06 VITALS — BP 138/90 | HR 69 | Temp 98.8°F | Resp 16 | Ht 66.75 in | Wt 265.8 lb

## 2016-01-06 DIAGNOSIS — M545 Low back pain: Secondary | ICD-10-CM | POA: Diagnosis not present

## 2016-01-06 DIAGNOSIS — I10 Essential (primary) hypertension: Secondary | ICD-10-CM | POA: Diagnosis not present

## 2016-01-06 DIAGNOSIS — Z86718 Personal history of other venous thrombosis and embolism: Secondary | ICD-10-CM | POA: Insufficient documentation

## 2016-01-06 DIAGNOSIS — M79604 Pain in right leg: Secondary | ICD-10-CM

## 2016-01-06 DIAGNOSIS — G8929 Other chronic pain: Secondary | ICD-10-CM

## 2016-01-06 LAB — BASIC METABOLIC PANEL
BUN: 23 mg/dL (ref 6–23)
CALCIUM: 9.8 mg/dL (ref 8.4–10.5)
CHLORIDE: 105 meq/L (ref 96–112)
CO2: 30 mEq/L (ref 19–32)
CREATININE: 1.28 mg/dL (ref 0.40–1.50)
GFR: 61.02 mL/min (ref >60.00)
Glucose, Bld: 102 mg/dL — ABNORMAL HIGH (ref 70–99)
Potassium: 4.9 mEq/L (ref 3.5–5.1)
Sodium: 141 mEq/L (ref 135–145)

## 2016-01-06 NOTE — Progress Notes (Signed)
Pre visit review using our clinic review tool, if applicable. No additional management support is needed unless otherwise documented below in the visit note. 

## 2016-01-06 NOTE — Progress Notes (Signed)
OFFICE VISIT  01/06/2016   CC:  Chief Complaint  Patient presents with  . Back Pain    has copy of xray  . DVT    in right calf  . Referral    for hearing and vision   HPI:    Patient is a 60 y.o. Caucasian male who presents for f/u  R leg venous thrombosis, was dx'd about 3 mo ago in [place] and was put on an ointment called thrombocide x 3 weeks.  Unclear whether deep or superficial b/c imaging was not done per pt.  Swelling went down and pain resolved after 3 weeks of the ointment.   Says R leg feels back to normal now.  Back started hurting about 9 mo ago while in [place], hurts in low back, lately transferring into hips area.  For the last few months he has had intermittent R leg radiating pain into thigh level.  No tingling or numbness in legs. Now his biggest pain is focal mid-to-distal R thigh pain for the last 3d. No trauma.  He went to [facility] in [place] and got x-ray and was told intervertebral disc is degenerating.    No home/outside bp monitoring to report.  He is compliant with bp med.  Past Medical History:  Diagnosis Date  . Anxiety   . Depression, major 12/2012  . DVT (deep venous thrombosis) ([facility]) 1997   left leg; s/p knee surgery.  Coumadin x 40mo  . Erectile dysfunction   . GERD (gastroesophageal reflux disease)   . Hyperlipidemia    Anal leakage/intolerance to atorva and prava: pt declines further statin trials  . Hypertension   . Insomnia   . Shingles    ?    Past Surgical History:  Procedure Laterality Date  . COLONOSCOPY  2006   [place] ([place] of [place])  . HERNIA REPAIR  20947  umbillical  . ROTATOR CUFF REPAIR  1995   right shoulder  . torn menincus  1997   left knee  . VASECTOMY  2008   MEDS: currently pt is taking 162 mg ASA qd Outpatient Medications Prior to Visit  Medication Sig Dispense Refill  . Ascorbic Acid (VITAMIN C) 1000 MG tablet Take 1,000 mg by mouth daily.      .Marland Kitchenaspirin 81 MG tablet Take 162 mg by mouth daily.     . enalapril  (VASOTEC) 20 MG tablet TAKE 1 TABLET (20 MG TOTAL) BY MOUTH DAILY. 90 tablet 3  . esomeprazole (NEXIUM) 40 MG capsule Take 1 capsule (40 mg total) by mouth daily before breakfast. (Patient taking differently: Take 20 mg by mouth daily before breakfast. ) 90 capsule 1  . Multiple Vitamin (MULTIVITAMIN) tablet Take 1 tablet by mouth daily.      .Marland Kitchenzolpidem (AMBIEN) 10 MG tablet Take 1 tablet (10 mg total) by mouth at bedtime as needed. for sleep 90 tablet 1  . Na Sulfate-K Sulfate-Mg Sulf (SUPREP BOWEL PREP) SOLN Take 1 kit by mouth once. suprep as directed. No substitutions (Patient not taking: Reported on 01/06/2016) 354 mL 0  . sildenafil (VIAGRA) 100 MG tablet 1/2-1 tab po qd prn (Patient not taking: Reported on 01/06/2016) 9 tablet 6   No facility-administered medications prior to visit.     No Known Allergies  ROS As per HPI  PE: Blood pressure 138/90, pulse 69, temperature 98.8 F (37.1 C), temperature source Oral, resp. rate 16, height 5' 6.75" (1.695 m), weight 265 lb 12 oz (120.5 kg), SpO2 98 %.  Gen: Alert, well appearing.  Patient is oriented to person, place, time, and situation. AFFECT: pleasant, lucid thought and speech. BACK: non-tender to touch. Supine SLR neg bilat.  LE strength 5/5 prox/dist bilat.  He has a focal area of tenderness in distal, anterolateral quad muscle on R leg. No edema.  No calf asymmetry or tenderness.  No skin erythema.  LABS:  None today    Chemistry      Component Value Date/Time   NA 142 12/18/2014 1101   K 4.6 12/18/2014 1101   CL 104 12/18/2014 1101   CO2 30 12/18/2014 1101   BUN 15 12/18/2014 1101   CREATININE 1.09 12/18/2014 1101   CREATININE 1.01 07/04/2010 0931      Component Value Date/Time   CALCIUM 10.0 12/18/2014 1101   ALKPHOS 68 12/18/2014 1101   AST 22 12/18/2014 1101   ALT 39 12/18/2014 1101   BILITOT 0.5 12/18/2014 1101     Lab Results  Component Value Date   CHOL 270 (H) 12/18/2014   HDL 46.80 12/18/2014    LDLCALC 195 (H) 12/18/2014   TRIG 139.0 12/18/2014   CHOLHDL 6 12/18/2014   IMPRESSION AND PLAN:  1) Right leg pain; he has unclear history of DVT in R calf a few months ago while in [place] that was never imaged.  I think he has focal myofascial pain---not neuropathic pain from his spine. However, given unclear DVT history I think the most prudent thing to do would be to rule out DVT with R Leg venous doppler u/s today.  2) HTN: The current medical regimen is effective;  continue present plan and medications. Check lytes/cr today.  3) Low back pain: DDD.  He may have some lumbosacral and hip pain referred from this but I don't think he's really describing radiculopathy pain, nor did I detect any on exam. He has ortho consult visit set up already for tomorrow.  An After Visit Summary was printed and given to the patient.  Spent 25 min with pt today, with >50% of this time spent in counseling and care coordination regarding the above problems.  FOLLOW UP: Return for approx 7 mo for routine chronic illness f/u.  Signed:  Crissie Sickles, MD           01/06/2016

## 2016-01-11 ENCOUNTER — Other Ambulatory Visit: Payer: Self-pay | Admitting: Family Medicine

## 2016-01-11 MED ORDER — ZOLPIDEM TARTRATE 10 MG PO TABS: 10.0000 mg | ORAL_TABLET | Freq: Every evening | ORAL | 1 refills | Status: DC | PRN

## 2016-01-11 NOTE — Telephone Encounter (Signed)
Patient's wife calling to report rx for zolpidem (AMBIEN) 10 MG tablet was supposed to be called in during patient's ov last week.  However, patient went to pharmacy to pick medication up and pharmacy informed him they never received rx. Pharmacy:   [id number] - [place], [place] - [address] [phone] (Phone) [phone] (Fax)

## 2016-01-11 NOTE — Telephone Encounter (Signed)
Left detailed message that medication was sent to pharmacy.

## 2016-01-11 NOTE — Telephone Encounter (Signed)
RF request for zolpidem LOV: 01/06/16 Next ov: None Last written: 02/08/15 #90 w/ 1RF  Please advise. Thanks.

## 2016-06-06 ENCOUNTER — Telehealth: Payer: Self-pay

## 2016-06-06 DIAGNOSIS — R21 Rash and other nonspecific skin eruption: Secondary | ICD-10-CM

## 2016-06-06 NOTE — Telephone Encounter (Signed)
Patient's wife, Raynelle FanningJulie, called and scheduled appt for June 8, @1 :30.  Follow up visit and refills on medications.  Patient in states for only couple of weeks. Requesting referral to Derm for skin check for rash on June 18th.

## 2016-06-06 NOTE — Telephone Encounter (Signed)
Message left on patient's voice mail.

## 2016-06-06 NOTE — Telephone Encounter (Signed)
Derm referral ordered as per pt request. Pls tell them we can't guarantee pt will be able to get in with a dermatologist on June 18, but we'll try.-thx

## 2016-06-07 ENCOUNTER — Encounter: Payer: Self-pay | Admitting: Family Medicine

## 2016-06-16 ENCOUNTER — Ambulatory Visit: Payer: BLUE CROSS/BLUE SHIELD | Admitting: Family Medicine

## 2016-06-16 NOTE — Progress Notes (Deleted)
OFFICE VISIT  06/16/2016   CC: No chief complaint on file.    HPI:    Patient is a 61 y.o. Caucasian male who presents for f/u HTN, GERD, insomnia.  Past Medical History:  Diagnosis Date  . Anxiety   . Depression, major 12/2012  . DVT (deep venous thrombosis) ([facility]CC) 1997   left leg; s/p knee surgery.  Coumadin x 103mo.  . Erectile dysfunction   . GERD (gastroesophageal reflux disease)   . Hyperlipidemia    Anal leakage/intolerance to atorva and prava: pt declines further statin trials  . Hypertension   . Insomnia   . Shingles    ?    Past Surgical History:  Procedure Laterality Date  . COLONOSCOPY  2006   [place] of [place])  . HERNIA REPAIR  2006   umbillical  . ROTATOR CUFF REPAIR  1995   right shoulder  . torn menincus  1997   left knee  . VASECTOMY  2008    Outpatient Medications Prior to Visit  Medication Sig Dispense Refill  . Ascorbic Acid (VITAMIN C) 1000 MG tablet Take 1,000 mg by mouth daily.      Marland Kitchen. aspirin 81 MG tablet Take 162 mg by mouth daily.     . enalapril (VASOTEC) 20 MG tablet TAKE 1 TABLET (20 MG TOTAL) BY MOUTH DAILY. 90 tablet 3  . esomeprazole (NEXIUM) 40 MG capsule Take 1 capsule (40 mg total) by mouth daily before breakfast. (Patient taking differently: Take 20 mg by mouth daily before breakfast. ) 90 capsule 1  . Multiple Vitamin (MULTIVITAMIN) tablet Take 1 tablet by mouth daily.      Marland Kitchen. zolpidem (AMBIEN) 10 MG tablet Take 1 tablet (10 mg total) by mouth at bedtime as needed. for sleep 90 tablet 1   No facility-administered medications prior to visit.     No Known Allergies  ROS As per HPI  PE: There were no vitals taken for this visit. ***  LABS:  Lab Results  Component Value Date   TSH 1.73 12/18/2014   Lab Results  Component Value Date   WBC 7.2 12/18/2014   HGB 16.9 12/18/2014   HCT 50.5 12/18/2014   MCV 93.9 12/18/2014   PLT 275.0 12/18/2014   Lab Results  Component Value Date   CREATININE 1.28 01/06/2016   BUN  23 01/06/2016   NA 141 01/06/2016   K 4.9 01/06/2016   CL 105 01/06/2016   CO2 30 01/06/2016   Lab Results  Component Value Date   ALT 39 12/18/2014   AST 22 12/18/2014   ALKPHOS 68 12/18/2014   BILITOT 0.5 12/18/2014   Lab Results  Component Value Date   CHOL 270 (H) 12/18/2014   Lab Results  Component Value Date   HDL 46.80 12/18/2014   Lab Results  Component Value Date   LDLCALC 195 (H) 12/18/2014   Lab Results  Component Value Date   TRIG 139.0 12/18/2014   Lab Results  Component Value Date   CHOLHDL 6 12/18/2014   Lab Results  Component Value Date   PSA 0.62 12/18/2014   PSA 0.75 04/21/2013   PSA 0.64 07/04/2010    IMPRESSION AND PLAN:  No problem-specific Assessment & Plan notes found for this encounter.   FOLLOW UP: No Follow-up on file.

## 2016-06-19 ENCOUNTER — Encounter: Payer: Self-pay | Admitting: Family Medicine

## 2016-06-26 ENCOUNTER — Encounter: Payer: Self-pay | Admitting: Family Medicine

## 2016-06-26 ENCOUNTER — Ambulatory Visit (INDEPENDENT_AMBULATORY_CARE_PROVIDER_SITE_OTHER): Payer: BLUE CROSS/BLUE SHIELD | Admitting: Family Medicine

## 2016-06-26 VITALS — BP 116/76 | HR 82 | Temp 98.6°F | Resp 16 | Ht 66.75 in | Wt 277.2 lb

## 2016-06-26 DIAGNOSIS — M5441 Lumbago with sciatica, right side: Secondary | ICD-10-CM

## 2016-06-26 DIAGNOSIS — G8929 Other chronic pain: Secondary | ICD-10-CM | POA: Diagnosis not present

## 2016-06-26 DIAGNOSIS — Z1211 Encounter for screening for malignant neoplasm of colon: Secondary | ICD-10-CM

## 2016-06-26 DIAGNOSIS — I10 Essential (primary) hypertension: Secondary | ICD-10-CM | POA: Diagnosis not present

## 2016-06-26 DIAGNOSIS — Z125 Encounter for screening for malignant neoplasm of prostate: Secondary | ICD-10-CM | POA: Diagnosis not present

## 2016-06-26 DIAGNOSIS — Z23 Encounter for immunization: Secondary | ICD-10-CM

## 2016-06-26 DIAGNOSIS — Z0000 Encounter for general adult medical examination without abnormal findings: Secondary | ICD-10-CM

## 2016-06-26 LAB — BASIC METABOLIC PANEL
BUN: 21 mg/dL (ref 6–23)
CALCIUM: 10 mg/dL (ref 8.4–10.5)
CO2: 29 mEq/L (ref 19–32)
Chloride: 106 mEq/L (ref 96–112)
Creatinine, Ser: 1.16 mg/dL (ref 0.40–1.50)
GFR: 68.26 mL/min (ref >60.00)
Glucose, Bld: 119 mg/dL — ABNORMAL HIGH (ref 70–99)
POTASSIUM: 4.5 meq/L (ref 3.5–5.1)
SODIUM: 140 meq/L (ref 135–145)

## 2016-06-26 LAB — PSA: PSA: 0.69 ng/mL (ref 0.10–4.00)

## 2016-06-26 MED ORDER — ENALAPRIL MALEATE 20 MG PO TABS: ORAL_TABLET | ORAL | 3 refills | Status: DC

## 2016-06-26 MED ORDER — ZOLPIDEM TARTRATE 10 MG PO TABS: 10.0000 mg | ORAL_TABLET | Freq: Every evening | ORAL | 1 refills | Status: DC | PRN

## 2016-06-26 NOTE — Addendum Note (Signed)
Addended by: Smitty KnudsenSUTHERLAND, HEATHER K on: 06/26/2016 02:42 PM   Modules accepted: Orders

## 2016-06-26 NOTE — Progress Notes (Signed)
Office Note 06/26/2016  CC:  Chief Complaint  Patient presents with  . Follow-up    RCI, pt is not fasting.     HPI:  Kyle Bryant is a 61 y.o. White male who is here for f/u chronic problems/health maintenance exam. He is not fasting. He is on break from working in [place] for [place] 1 week. His mom died last week unfortunately.    Colon ca screening: He was referred for colonoscopy back in 2016.  He is interested in getting new referral today.  HTN: pt does not do any bp monitoring at home or work.  He says that next f/u visit he will likely want to schedule an orthopedist eval: [facility] and dx'd with lumbar DDD.  Now he is noting the pain extend more into R hip and sometimes down R leg.  No weakness, no saddle anesthesia, no loss of bowel/bladder control.  He is interested in a referral to a different orthopedist.  ROS: no HAs, no vision complaints, no dizziness, no CP, no SOB, no rash, no melena/hematochezia.  Past Medical History:  Diagnosis Date  . Anxiety   . Depression, major 12/2012  . DVT (deep venous thrombosis) (HCC) 1997   left leg; s/p knee surgery.  Coumadin x 62mo.  . Erectile dysfunction   . GERD (gastroesophageal reflux disease)   . Hyperlipidemia    Anal leakage/intolerance to atorva and prava: pt declines further statin trials  . Hypertension   . Insomnia   . Shingles    ?    Past Surgical History:  Procedure Laterality Date  . [place]OSCOPY  [place]   [place]  . HERNIA REPAIR  2006   umbillical  . ROTATOR CUFF REPAIR  1995   right shoulder  . torn menincus  1997   left knee  . VASECTOMY  2008    Family History  Problem Relation Age of Onset  . Hypertension Father   . Diabetes Father        type 2  . Cancer Father        lymphoma  . Birth defects Sister   . Cancer Sister        breast- survivor  . Other Sister        blood clot  . Diabetes Brother        type  2  . Colon cancer Neg Hx   . Colon polyps Neg Hx   . Esophageal cancer Neg Hx   . Rectal cancer Neg Hx   . Stomach cancer Neg Hx     Social History   Social History  . Marital status: Married    Spouse name: N/A  . Number of children: N/A  . Years of education: N/A   Occupational History  . Not on file.   Social History Main Topics  . Smoking status: Former Smoker    Packs/day: 2.00    Types: Cigarettes, Pipe, Cigars    Quit date: 01/09/1990  . Smokeless tobacco: Former NeurosurgeonUser    Types: Snuff, Chew    Quit date: 01/09/1978  . Alcohol use 0.0 oz/week     Comment: 1 pint weekly  . Drug use: No  . Sexual activity: Yes    Partners: Female   Other Topics Concern  . Not on file   Social History Narrative   Married, daughter in college.     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   Lived in [place] prior to [place].   No smoker.  Walks 5mi one day per week.   Occasional drink of ETOH.          Outpatient Medications Prior to Visit  Medication Sig Dispense Refill  . Ascorbic Acid (VITAMIN C) 1000 MG tablet Take 1,000 mg by mouth daily.      Marland Kitchen aspirin 81 MG tablet Take 162 mg by mouth daily.     . enalapril (VASOTEC) 20 MG tablet TAKE 1 TABLET (20 MG TOTAL) BY MOUTH DAILY. 90 tablet 3  . esomeprazole (NEXIUM) 40 MG capsule Take 1 capsule (40 mg total) by mouth daily before breakfast. (Patient taking differently: Take 20 mg by mouth daily before breakfast. ) 90 capsule 1  . Multiple Vitamin (MULTIVITAMIN) tablet Take 1 tablet by mouth daily.      Marland Kitchen zolpidem (AMBIEN) 10 MG tablet Take 1 tablet (10 mg total) by mouth at bedtime as needed. for sleep 90 tablet 1   No facility-administered medications prior to visit.     No Known Allergies  ROS Review of Systems  Constitutional: Negative for appetite change, chills, fatigue and fever.  HENT: Negative for congestion, dental problem, ear pain and sore throat.   Eyes: Negative for discharge, redness and visual  disturbance.  Respiratory: Negative for cough, chest tightness, shortness of breath and wheezing.   Cardiovascular: Negative for chest pain, palpitations and leg swelling.  Gastrointestinal: Negative for abdominal pain, blood in stool, diarrhea, nausea and vomiting.  Genitourinary: Negative for difficulty urinating, dysuria, flank pain, frequency, hematuria and urgency.  Musculoskeletal: Positive for arthralgias (right hip) and back pain (chronic). Negative for joint swelling, myalgias and neck stiffness.  Skin: Negative for pallor and rash.  Neurological: Negative for dizziness, speech difficulty, weakness and headaches.  Hematological: Negative for adenopathy. Does not bruise/bleed easily.  Psychiatric/Behavioral: Negative for confusion and sleep disturbance. The patient is not nervous/anxious.     PE; Blood pressure 116/76, pulse 82, temperature 98.6 F (37 C), temperature source Oral, resp. rate 16, height 5' 6.75" (1.695 m), weight 277 lb 4 oz (125.8 kg), SpO2 96 %. Gen: Alert, well appearing.  Patient is oriented to person, place, time, and situation. AFFECT: pleasant, lucid thought and speech. CV: RRR, no m/r/g.   LUNGS: CTA bilat, nonlabored resps, good aeration in all lung fields. EXT: no clubbing, cyanosis, or edema.   Pertinent labs:  Lab Results  Component Value Date   TSH 1.73 12/18/2014   Lab Results  Component Value Date   WBC 7.2 12/18/2014   HGB 16.9 12/18/2014   HCT 50.5 12/18/2014   MCV 93.9 12/18/2014   PLT 275.0 12/18/2014   Lab Results  Component Value Date   CREATININE 1.28 01/06/2016   BUN 23 01/06/2016   NA 141 01/06/2016   K 4.9 01/06/2016   CL 105 01/06/2016   CO2 30 01/06/2016   Lab Results  Component Value Date   ALT 39 12/18/2014   AST 22 12/18/2014   ALKPHOS 68 12/18/2014   BILITOT 0.5 12/18/2014   Lab Results  Component Value Date   CHOL 270 (H) 12/18/2014   Lab Results  Component Value Date   HDL 46.80 12/18/2014   Lab Results   Component Value Date   LDLCALC 195 (H) 12/18/2014   Lab Results  Component Value Date   TRIG 139.0 12/18/2014   Lab Results  Component Value Date   CHOLHDL 6 12/18/2014   Lab Results  Component  Value Date   PSA 0.62 12/18/2014   PSA 0.75 04/21/2013   PSA 0.64 07/04/2010    ASSESSMENT AND PLAN:   1) HTN: The current medical regimen is effective;  continue present plan and medications. Encouraged pt to check his bp at work weekly. Lytes/cr today.  2) Prostate ca screening: DRE normal today, PSA drawn.  3) Colon ca screening: referred pt to [facility] again today.  4) Right LBP with sciatica vs hip osteoarthritis sx's now: pt desires referral to ortho but he does not want to return to [facility]. I ordered referral to [facility] today.  5) Preventative health care: shingrix #1 given today.  An After Visit Summary was printed and given to the patient.  FOLLOW UP:  Return in about 6 months (around 12/26/2016) for routine chronic illness f/u.  Signed:  Santiago Bumpers, MD           06/26/2016

## 2016-07-03 ENCOUNTER — Encounter: Payer: Self-pay | Admitting: Family Medicine

## 2016-07-03 ENCOUNTER — Ambulatory Visit (HOSPITAL_BASED_OUTPATIENT_CLINIC_OR_DEPARTMENT_OTHER)
Admission: RE | Admit: 2016-07-03 | Discharge: 2016-07-03 | Disposition: A | Discharge status: Home / Self Care | Payer: BLUE CROSS/BLUE SHIELD | Source: Ambulatory Visit | Attending: Family Medicine | Admitting: Family Medicine

## 2016-07-03 ENCOUNTER — Ambulatory Visit (INDEPENDENT_AMBULATORY_CARE_PROVIDER_SITE_OTHER): Payer: BLUE CROSS/BLUE SHIELD | Admitting: Family Medicine

## 2016-07-03 VITALS — BP 143/83 | HR 94 | Temp 98.3°F | Resp 16 | Ht 66.75 in | Wt 270.8 lb

## 2016-07-03 DIAGNOSIS — M79661 Pain in right lower leg: Secondary | ICD-10-CM

## 2016-07-03 DIAGNOSIS — Z86718 Personal history of other venous thrombosis and embolism: Secondary | ICD-10-CM

## 2016-07-03 NOTE — Progress Notes (Signed)
OFFICE VISIT  07/03/2016   CC:  Chief Complaint  Patient presents with  . Leg Pain    right     HPI:    Patient is a 61 y.o. Caucasian male who presents for pain in R shin and lateral calf onset last night. Often has had R leg radiating pain from LB but this time it was not assiated with this pain.   No new swelling in the leg.  He has daily mild swelling from chronic LE venous insufficiency.  Hx of 2 DVTs: see PMH below for info on first one (left leg--popliteal region). Had 2nd clot 7-8 mo ago [place]'d in [place] ([place])--very unclear dx (no imaging was done, and he was treated with some sort of short term topical heparin product), then f/u ultrasound here was clear.   No recent SOB or fevers.  Most recent long airplane flight was 06/11/16.  No recent surgical procedure. No FH of hypercoagulable state.  Past Medical History:  Diagnosis Date  . Anxiety   . Depression, major 12/2012  . DVT (deep venous thrombosis) (HCC) 1997   left leg; s/p knee surgery.  Coumadin x 84mo.  . Erectile dysfunction   . GERD (gastroesophageal reflux disease)   . Hyperlipidemia    Anal leakage/intolerance to atorva and prava: pt declines further statin trials  . Hypertension   . Insomnia   . Shingles    ?    Past Surgical History:  Procedure Laterality Date  . COLONOSCOPY  2006   Normal (out of state)  . HERNIA REPAIR  2006   umbillical  . ROTATOR CUFF REPAIR  1995   right shoulder  . torn menincus  1997   left knee  . VASECTOMY  2008    Outpatient Medications Prior to Visit  Medication Sig Dispense Refill  . Ascorbic Acid (VITAMIN C) 1000 MG tablet Take 1,000 mg by mouth daily.      Marland Kitchen. aspirin 81 MG tablet Take 162 mg by mouth daily.     . enalapril (VASOTEC) 20 MG tablet TAKE 1 TABLET (20 MG TOTAL) BY MOUTH DAILY. 90 tablet 3  . esomeprazole (NEXIUM) 40 MG capsule Take 1 capsule (40 mg total) by mouth daily before breakfast. (Patient taking differently: Take 20 mg by mouth daily  before breakfast. ) 90 capsule 1  . Multiple Vitamin (MULTIVITAMIN) tablet Take 1 tablet by mouth daily.      Marland Kitchen. zolpidem (AMBIEN) 10 MG tablet Take 1 tablet (10 mg total) by mouth at bedtime as needed. for sleep 90 tablet 1   No facility-administered medications prior to visit.     No Known Allergies  ROS As per HPI  PE: Blood pressure (!) 143/83, pulse 94, temperature 98.3 F (36.8 C), temperature source Oral, resp. rate 16, height 5' 6.75" (1.695 m), weight 270 lb 12 oz (122.8 kg), SpO2 96 %. Gen: Alert, well appearing.  Patient is oriented to person, place, time, and situation. AFFECT: pleasant, lucid thought and speech. Legs: no pitting edema.  He has some mild non-pitting edema bilat. He has some focal TTP in posterolateral gastroc region as well as some in anterolateral soft tissues of R LL at the same level.  Tenderness comes with deep palpation of these areas. I feel no cord/nodule.  There is no erythema or warmth.  He has some scattered, faint, non-inflamed varicose veins bilat.  Calf circumference: on R at 15 cm below inf border of patella is 46 cm, left is 45.5 cm  at same level.  LABS:  none  IMPRESSION AND PLAN:  Right calf pain, acute, in leg with questionable past history of DVT about 8 months ago dx'd in [place] in a non-traditional manner. Has hx of normal LE venous doppler AFTER this most recent possible DVT when he came back to the [place]. Will arrange for LE venous doppler u/s to r/o DVT today. No new meds at this moment.  An After Visit Summary was printed and given to the patient.  FOLLOW UP: Return to be determined based on result of ultrasound.  Signed:  Santiago Bumpers, MD           07/03/2016

## 2016-07-26 ENCOUNTER — Encounter: Payer: Self-pay | Admitting: Family Medicine

## 2017-03-15 ENCOUNTER — Encounter: Payer: Self-pay | Admitting: *Deleted

## 2017-06-09 DIAGNOSIS — R7303 Prediabetes: Secondary | ICD-10-CM

## 2017-06-09 HISTORY — DX: Prediabetes: R73.03

## 2017-06-20 ENCOUNTER — Other Ambulatory Visit: Payer: Self-pay | Admitting: Family Medicine

## 2017-06-20 NOTE — Telephone Encounter (Signed)
Left message for pt to call back  °

## 2017-06-20 NOTE — Telephone Encounter (Signed)
Needs office visit for follow up or CPE.   Will send Rx for #90 w/ 0RF.

## 2017-06-27 NOTE — Telephone Encounter (Signed)
SW pts wife, okay per DPR.   She stated that pt will be leaving to go back to [place] Monday and will need an apt before then b/c he will not be back until after December. (They live here for 6 months, and in [place] for 6 months.)  Apt made for 06/28/17 at 1:15pm.

## 2017-06-28 ENCOUNTER — Ambulatory Visit (INDEPENDENT_AMBULATORY_CARE_PROVIDER_SITE_OTHER): Payer: PRIVATE HEALTH INSURANCE | Admitting: Family Medicine

## 2017-06-28 ENCOUNTER — Encounter: Payer: Self-pay | Admitting: Family Medicine

## 2017-06-28 VITALS — BP 125/83 | HR 88 | Temp 98.3°F | Resp 16 | Ht 66.5 in | Wt 271.1 lb

## 2017-06-28 DIAGNOSIS — E7800 Pure hypercholesterolemia, unspecified: Secondary | ICD-10-CM

## 2017-06-28 DIAGNOSIS — F5101 Primary insomnia: Secondary | ICD-10-CM

## 2017-06-28 DIAGNOSIS — I10 Essential (primary) hypertension: Secondary | ICD-10-CM | POA: Diagnosis not present

## 2017-06-28 DIAGNOSIS — M170 Bilateral primary osteoarthritis of knee: Secondary | ICD-10-CM | POA: Diagnosis not present

## 2017-06-28 DIAGNOSIS — Z23 Encounter for immunization: Secondary | ICD-10-CM | POA: Diagnosis not present

## 2017-06-28 MED ORDER — IBUPROFEN 800 MG PO TABS: ORAL_TABLET | ORAL | 1 refills | Status: AC

## 2017-06-28 MED ORDER — ZOLPIDEM TARTRATE 10 MG PO TABS: 10.0000 mg | ORAL_TABLET | Freq: Every evening | ORAL | 1 refills | Status: DC | PRN

## 2017-06-28 MED ORDER — ESOMEPRAZOLE MAGNESIUM 40 MG PO CPDR: 40.0000 mg | DELAYED_RELEASE_CAPSULE | Freq: Every day | ORAL | 1 refills | Status: AC

## 2017-06-28 MED ORDER — ENALAPRIL MALEATE 20 MG PO TABS: ORAL_TABLET | ORAL | 1 refills | Status: DC

## 2017-06-28 NOTE — Progress Notes (Signed)
OFFICE VISIT  06/28/2017   CC:  Chief Complaint  Patient presents with  . Follow-up    RCI, pt is not fasting.     HPI:    Patient is a 62 y.o. Caucasian male who presents for 6 mo f/u HTN, HLD, insomnia.   HTN: does not check bp any.  At MD visit recently his bp was "pretty high"---approx 150/102. Recheck a few days later it went down into 130/80 range. Compliant with med.  HLD: intolerant of statins and refuses any further trial. Wt is yo-yo'ing. When out of country he eats much better so his wt drops some. High on priority list.  Insomnia: he has cut way back on ambien use.  Has occ hangover effect if he takes it every day. Now he only takes it 5-6 times per month.  Hx of osteoarthritis of multiple joints, L>R knee.  Uses ibup/zantac regularly (duexis). Having some insurance troubles with this med.  Asks for ibup 800mg  to take bid.   Past Medical History:  Diagnosis Date  . Anxiety   . Depression, major 12/2012  . DVT (deep venous thrombosis) (HCC) 1997   left leg; s/p knee surgery.  Coumadin x 75mo.  . Erectile dysfunction   . GERD (gastroesophageal reflux disease)   . Hyperlipidemia    Anal leakage/intolerance to atorva and prava: pt declines further statin trials  . Hypertension   . Insomnia   . Shingles    ?    Past Surgical History:  Procedure Laterality Date  . COLONOSCOPY  2006   Normal (out of state)  . HERNIA REPAIR  2006   umbillical  . ROTATOR CUFF REPAIR  1995   right shoulder  . torn menincus  1997   left knee  . VASECTOMY  2008   Social History   Socioeconomic History  . Marital status: Married    Spouse name: Not on file  . Number of children: Not on file  . Years of education: Not on file  . Highest education level: Not on file  Occupational History  . Not on file  Social Needs  . Financial resource strain: Not on file  . Food insecurity:    Worry: Not on file    Inability: Not on file  . Transportation needs:    Medical:  Not on file    Non-medical: Not on file  Tobacco Use  . Smoking status: Former Smoker    Packs/day: 2.00    Types: Cigarettes, Pipe, Cigars    Last attempt to quit: 01/09/1990    Years since quitting: 27.4  . Smokeless tobacco: Former Neurosurgeon    Types: Snuff, Dorna Bloom    Quit date: 01/09/1978  Substance and Sexual Activity  . Alcohol use: Yes    Alcohol/week: 0.0 oz    Comment: 1 pint weekly  . Drug use: No  . Sexual activity: Yes    Partners: Female  Lifestyle  . Physical activity:    Days per week: Not on file    Minutes per session: Not on file  . Stress: Not on file  Relationships  . Social connections:    Talks on phone: Not on file    Gets together: Not on file    Attends religious service: Not on file    Active member of club or organization: Not on file    Attends meetings of clubs or organizations: Not on file    Relationship status: Not on file  Other Topics Concern  .  Not on file  Social History Narrative   Married, daughter in college.     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   [place] in [place]   No smoker.  Walks 5mi one day per week.   Occasional drink of ETOH.          Outpatient Medications Prior to Visit  Medication Sig Dispense Refill  . Ascorbic Acid (VITAMIN C) 1000 MG tablet Take 1,000 mg by mouth daily.      Marland Kitchen. aspirin 81 MG tablet Take 162 mg by mouth daily.     . Multiple Vitamin (MULTIVITAMIN) tablet Take 1 tablet by mouth daily.      . DUEXIS 800-26.6 MG TABS Take 1 tablet by mouth 3 (three) times daily as needed.  2  . enalapril (VASOTEC) 20 MG tablet TAKE 1 TABLET BY MOUTH EVERY DAY 90 tablet 0  . esomeprazole (NEXIUM) 40 MG capsule Take 1 capsule (40 mg total) by mouth daily before breakfast. (Patient taking differently: Take 20 mg by mouth daily before breakfast. ) 90 capsule 1  . zolpidem (AMBIEN) 10 MG tablet Take 1 tablet (10 mg total) by mouth at bedtime as needed. for sleep 90 tablet 1   No  facility-administered medications prior to visit.     No Known Allergies  ROS As per HPI  PE: Blood pressure 125/83, pulse 88, temperature 98.3 F (36.8 C), temperature source Oral, resp. rate 16, height 5' 6.5" (1.689 m), weight 271 lb 2 oz (123 kg), SpO2 97 %. Gen: Alert, well appearing.  Patient is oriented to person, place, time, and situation. AFFECT: pleasant, lucid thought and speech. No further exam today.  LABS:  Lab Results  Component Value Date   TSH 1.73 12/18/2014   Lab Results  Component Value Date   WBC 7.2 12/18/2014   HGB 16.9 12/18/2014   HCT 50.5 12/18/2014   MCV 93.9 12/18/2014   PLT 275.0 12/18/2014   Lab Results  Component Value Date   CREATININE 1.16 06/26/2016   BUN 21 06/26/2016   NA 140 06/26/2016   K 4.5 06/26/2016   CL 106 06/26/2016   CO2 29 06/26/2016   Lab Results  Component Value Date   ALT 39 12/18/2014   AST 22 12/18/2014   ALKPHOS 68 12/18/2014   BILITOT 0.5 12/18/2014   Lab Results  Component Value Date   CHOL 270 (H) 12/18/2014   Lab Results  Component Value Date   HDL 46.80 12/18/2014   Lab Results  Component Value Date   LDLCALC 195 (H) 12/18/2014   Lab Results  Component Value Date   TRIG 139.0 12/18/2014   Lab Results  Component Value Date   CHOLHDL 6 12/18/2014   Lab Results  Component Value Date   PSA 0.69 06/26/2016   PSA 0.62 12/18/2014   PSA 0.75 04/21/2013     IMPRESSION AND PLAN:  1) HTN: The current medical regimen is effective;  continue present plan and medications. BMET today.  2) HLD: continue to work [employer] on TLC/wt loss.  3) Insomnia: improved.  Using [place] much less frequently. New rx today for #30, RF x 1.  4) Osteoarthritis, primarily knees.  I rx'd ibup 800 mg bid and recommended he go back to taking esomeprazole 40mg  instead of just 20mg  qd.  Preventative health care: Colon ca screening:  Referred to GI 06/2016 b/c he was due for repeat colonoscopy in 2016.  He never got  this. Due to  pt's schedule (returning to [place] in 4d) we'll re-refer at next CPE in 6 mo. Due for prostate ca screening: will do this at CPE in 6 mo as well. Shingrix #2 today.  An After Visit Summary was printed and given to the patient.  FOLLOW UP: Return in about 6 months (around 12/28/2017) for annual CPE (fasting).  Signed:  Santiago Bumpers, MD           06/28/2017

## 2017-06-28 NOTE — Addendum Note (Signed)
Addended by: Regan RakersAY, LAURA K on: 06/28/2017 02:06 PM   Modules accepted: Orders

## 2017-06-30 ENCOUNTER — Encounter: Payer: Self-pay | Admitting: Family Medicine

## 2017-06-30 LAB — BASIC METABOLIC PANEL
BUN: 23 mg/dL (ref 7–25)
CHLORIDE: 105 mmol/L (ref 98–110)
CO2: 20 mmol/L (ref 20–32)
CREATININE: 1.12 mg/dL (ref 0.70–1.25)
Calcium: 10.2 mg/dL (ref 8.6–10.3)
Glucose, Bld: 130 mg/dL — ABNORMAL HIGH (ref 65–99)
POTASSIUM: 4.4 mmol/L (ref 3.5–5.3)
Sodium: 136 mmol/L (ref 135–146)

## 2017-06-30 LAB — HEMOGLOBIN A1C W/OUT EAG: HEMOGLOBIN A1C: 5.9 %{Hb} — AB (ref <5.7)

## 2017-06-30 LAB — TEST AUTHORIZATION

## 2017-06-30 LAB — EXTRA LAV TOP TUBE

## 2017-12-26 ENCOUNTER — Encounter: Payer: PRIVATE HEALTH INSURANCE | Admitting: Family Medicine

## 2018-02-09 HISTORY — PX: CARDIOVASCULAR STRESS TEST: SHX262

## 2018-03-04 ENCOUNTER — Telehealth: Payer: Self-pay | Admitting: Family Medicine

## 2018-03-04 NOTE — Telephone Encounter (Signed)
Received [facility] note that patient saw a physician in [place] regarding chest heaviness. His bp was 232/107. A stress test, EKG, and blood work was performed that showed premature PVC. The physician in [place] changed his bp meds and advised patient to get second opinion. Patient's wife left a message asking if these notes could be emailed. I left a message for her that email is not considered secure and that it would be better to mail or fax the notes.

## 2018-03-04 NOTE — Telephone Encounter (Signed)
Spoke with patient's wife Raynelle Fanning) and advise d it would be best for the patient to come to be seen due to current symptoms. She stated patient would not [place] back [place]. She is going to drop off the paperwork on Thursday regarding lab and tests that were done.

## 2018-03-05 ENCOUNTER — Encounter: Payer: Self-pay | Admitting: Family Medicine

## 2018-03-05 ENCOUNTER — Telehealth: Payer: Self-pay | Admitting: Family Medicine

## 2018-03-05 NOTE — Telephone Encounter (Signed)
I reviewed his labs/stress test, etc from [place].  His biggest problem is uncontrolled high blood pressure. This can cause changes in the heart function that can lead to chest pain as well as impaired pumping of the heart muscle. Getting the blood pressure under control is a must.   Does he have a doctor there at all (one that can prescribe additional medications for his high blood pressure)? If not, I recommend he start an additional med to take along with his enalapril. Wife can pick it up and take it to him in [place] when she returns. He should stop his aspirin. He should check his blood pressure and heart rate every day after he starts the new med and call our office with report of these numbers when he has been on the new med for 2 wks (emphasize that he should continue taking enalapril). Pls eRx hctz 25mg , 1 tab po qd, #90, RF x 3.-thx

## 2018-03-05 NOTE — Telephone Encounter (Signed)
FYI

## 2018-03-06 NOTE — Telephone Encounter (Signed)
Left message for patient or patient's wife(Julie) to call back regarding PCP recommendations.

## 2018-03-11 NOTE — Telephone Encounter (Signed)
SW pts wife, she stated that pt was seen by a doctor there and was put on new BP medication bioprexum (perindopril) 10/2.5mg  1 tab daily, his other medication was d/c. She stated that he has been on this medication x 1 week and his BP this AM was 125/80. She stated that he is feeling better since starting the new medication.

## 2018-03-11 NOTE — Telephone Encounter (Signed)
Great. Noted. 

## 2018-12-10 DIAGNOSIS — R3129 Other microscopic hematuria: Secondary | ICD-10-CM

## 2018-12-10 DIAGNOSIS — K760 Fatty (change of) liver, not elsewhere classified: Secondary | ICD-10-CM

## 2018-12-10 HISTORY — DX: Fatty (change of) liver, not elsewhere classified: K76.0

## 2018-12-10 HISTORY — DX: Other microscopic hematuria: R31.29

## 2018-12-25 ENCOUNTER — Ambulatory Visit (INDEPENDENT_AMBULATORY_CARE_PROVIDER_SITE_OTHER): Payer: PRIVATE HEALTH INSURANCE | Admitting: Family Medicine

## 2018-12-25 ENCOUNTER — Other Ambulatory Visit: Payer: Self-pay

## 2018-12-25 ENCOUNTER — Encounter: Payer: Self-pay | Admitting: Family Medicine

## 2018-12-25 ENCOUNTER — Telehealth: Payer: Self-pay | Admitting: Family Medicine

## 2018-12-25 VITALS — BP 135/82 | HR 79 | Temp 98.2°F | Resp 16 | Ht 66.5 in | Wt 275.2 lb

## 2018-12-25 DIAGNOSIS — M2550 Pain in unspecified joint: Secondary | ICD-10-CM | POA: Diagnosis not present

## 2018-12-25 DIAGNOSIS — R7303 Prediabetes: Secondary | ICD-10-CM

## 2018-12-25 DIAGNOSIS — F5101 Primary insomnia: Secondary | ICD-10-CM

## 2018-12-25 DIAGNOSIS — Z9189 Other specified personal risk factors, not elsewhere classified: Secondary | ICD-10-CM | POA: Diagnosis not present

## 2018-12-25 DIAGNOSIS — I10 Essential (primary) hypertension: Secondary | ICD-10-CM

## 2018-12-25 DIAGNOSIS — Z91014 Allergy to mammalian meats: Secondary | ICD-10-CM

## 2018-12-25 DIAGNOSIS — R6881 Early satiety: Secondary | ICD-10-CM

## 2018-12-25 DIAGNOSIS — R140 Abdominal distension (gaseous): Secondary | ICD-10-CM

## 2018-12-25 DIAGNOSIS — R1084 Generalized abdominal pain: Secondary | ICD-10-CM | POA: Diagnosis not present

## 2018-12-25 DIAGNOSIS — M549 Dorsalgia, unspecified: Secondary | ICD-10-CM

## 2018-12-25 DIAGNOSIS — Z91018 Allergy to other foods: Secondary | ICD-10-CM

## 2018-12-25 LAB — URINALYSIS, ROUTINE W REFLEX MICROSCOPIC
Bilirubin Urine: NEGATIVE
Hgb urine dipstick: NEGATIVE
Ketones, ur: NEGATIVE
Leukocytes,Ua: NEGATIVE
Nitrite: NEGATIVE
Specific Gravity, Urine: 1.025 (ref 1.000–1.030)
Urine Glucose: NEGATIVE
Urobilinogen, UA: 0.2 (ref 0.0–1.0)
pH: 5.5 (ref 5.0–8.0)

## 2018-12-25 LAB — COMPREHENSIVE METABOLIC PANEL
ALT: 41 U/L (ref 0–53)
AST: 20 U/L (ref 0–37)
Albumin: 4.3 g/dL (ref 3.5–5.2)
Alkaline Phosphatase: 85 U/L (ref 39–117)
BUN: 16 mg/dL (ref 6–23)
CO2: 23 mEq/L (ref 19–32)
Calcium: 9.6 mg/dL (ref 8.4–10.5)
Chloride: 104 mEq/L (ref 96–112)
Creatinine, Ser: 1.23 mg/dL (ref 0.40–1.50)
GFR: 59.53 mL/min — ABNORMAL LOW (ref >60.00)
Glucose, Bld: 88 mg/dL (ref 70–99)
Potassium: 4.3 mEq/L (ref 3.5–5.1)
Sodium: 137 mEq/L (ref 135–145)
Total Bilirubin: 0.4 mg/dL (ref 0.2–1.2)
Total Protein: 7.1 g/dL (ref 6.0–8.3)

## 2018-12-25 LAB — CBC WITH DIFFERENTIAL/PLATELET
Basophils Absolute: 0.1 10*3/uL (ref 0.0–0.1)
Basophils Relative: 1.1 % (ref 0.0–3.0)
Eosinophils Absolute: 0.2 10*3/uL (ref 0.0–0.7)
Eosinophils Relative: 2.5 % (ref 0.0–5.0)
HCT: 46.1 % (ref 39.0–52.0)
Hemoglobin: 15.4 g/dL (ref 13.0–17.0)
Lymphocytes Relative: 26.1 % (ref 12.0–46.0)
Lymphs Abs: 2.1 10*3/uL (ref 0.7–4.0)
MCHC: 33.5 g/dL (ref 30.0–36.0)
MCV: 96.2 fl (ref 78.0–100.0)
Monocytes Absolute: 0.9 10*3/uL (ref 0.1–1.0)
Monocytes Relative: 10.7 % (ref 3.0–12.0)
Neutro Abs: 4.9 10*3/uL (ref 1.4–7.7)
Neutrophils Relative %: 59.6 % (ref 43.0–77.0)
Platelets: 305 10*3/uL (ref 150.0–400.0)
RBC: 4.79 Mil/uL (ref 4.22–5.81)
RDW: 12.7 % (ref 11.5–15.5)
WBC: 8.2 10*3/uL (ref 4.0–10.5)

## 2018-12-25 LAB — C-REACTIVE PROTEIN: CRP: 1.7 mg/dL (ref 0.5–20.0)

## 2018-12-25 LAB — SEDIMENTATION RATE: Sed Rate: 25 mm/hr — ABNORMAL HIGH (ref 0–20)

## 2018-12-25 LAB — H. PYLORI ANTIBODY, IGG: H Pylori IgG: NEGATIVE

## 2018-12-25 LAB — LIPASE: Lipase: 32 U/L (ref 11.0–59.0)

## 2018-12-25 LAB — HEMOGLOBIN A1C: Hgb A1c MFr Bld: 6.3 % (ref 4.6–6.5)

## 2018-12-25 LAB — IGA: IgA: 440 mg/dL — ABNORMAL HIGH (ref 68–378)

## 2018-12-25 MED ORDER — MEBENDAZOLE 100 MG PO CHEW: 100.0000 mg | CHEWABLE_TABLET | Freq: Two times a day (BID) | ORAL | 0 refills | Status: AC

## 2018-12-25 MED ORDER — ENALAPRIL MALEATE 20 MG PO TABS: ORAL_TABLET | ORAL | 1 refills | Status: AC

## 2018-12-25 NOTE — Telephone Encounter (Signed)
Patient's wife called.  Patient would like change the location for his Rx's to go to [facility].  Patient is out of blood pressure medication but made an appt for an acute issue.

## 2018-12-25 NOTE — Progress Notes (Signed)
OFFICE NOTE  12/25/2018  CC:  Chief Complaint  Patient presents with  . Stomach bloating    painful, x 6 months off and on    HPI:     Patient is a 63 y.o. Caucasian male who is here for f/u HTN and insomnia, IFG, plus acute complaints of stomach bloating/pain as well as early satiety. Onset:  Lower abdomen uncomfortable/mild pain all night long.  +Early satiety. Has been noting this off and on for the last month.  No fevers. Feels full w/out eating a normal meal. Appetite normal.  No nausea.  Occ upper abd discomfort as well as bloating/gas increased. Meat and pasta are particularly problematic.  No dairy probs.   Occ sensation he needs to have a BM but doesn't end up producing anything. Has feeling of constipation--chronic.  Was on metamucil daily until a few months ago.  BMs hard/pebbly, medium sized.  No BRBPR or melena.  No rectal pain.  No diarrhea. The discomfort ? Eases when he urinates.  Mild increased urgency last 6 mo.  No incontinence. No frequency.  No foul odor.  No known recent tick bite.   He just got back from [place] a few days ago.  Mostly drank bottled water. No rash. Of note, he has also noted intermittent periods of polyarthralgias lasting 24/7 for about a month at a time, about 1-2 times per year over the last several years, particularly spinal region but also diffusely in arms, legs, as well.  No joint swelling or redness noted. +Stiffness if prominent during these times.  Insomnia: doing fine w/out zolpidem at this time. PMP AWARE reviewed today: most recent rx for zolpidem  was filled 06/28/17, # 15, rx by me. No red flags.  HTN: no home bp monitoring. Compliant with med. Not eating very healthy diet.  No exercise but NOT sedentary.  IFG: not working on diet or exercise.  ROS: no CP, no SOB, no wheezing, no cough, no dizziness, no HAs, no rashes, no melena/hematochezia.  No polyuria or polydipsia. No HAs, no jaundice or pallor.  No cold or heat  intolerance.  No fevers.   Wt is down 19 lbs on our scales since his last visit 1 yr ago.   Pertinent PMH:  Past Medical History:  Diagnosis Date  . Anxiety   . Depression, major 12/2012  . DVT (deep venous thrombosis) (HCC) 1997   left leg; s/p knee surgery.  Coumadin x 44mo.  . Erectile dysfunction   . GERD (gastroesophageal reflux disease)   . Hyperlipidemia    Anal leakage/intolerance to atorva and prava: pt declines further statin trials  . Hypertension   . IFG (impaired fasting glucose) 06/2017   Gluc 130 06/2017 (pt went out of country for 6 mo after this lab), Hba1c 5.9% at that time.   . Insomnia   . Shingles    ?   Past Surgical History:  Procedure Laterality Date  . CARDIOVASCULAR STRESS TEST  02/2018   in [place].  Nl except markedly exaggerated hypertensive response to exercise.  Marland Kitchen COLONOSCOPY  2006   [place] of [place])  . HERNIA REPAIR  2006   umbillical  . ROTATOR CUFF REPAIR  1995   right shoulder  . torn menincus  1997   left knee  . VASECTOMY  2008   Social History   Socioeconomic History  . Marital status: Married    Spouse name: Not on file  . Number of children: Not on file  .  Years of education: Not on file  . Highest education level: Not on file  Occupational History  . Not on file  Tobacco Use  . Smoking status: Former Smoker    Packs/day: 2.00    Types: Cigarettes, Pipe, Cigars    Quit date: 01/09/1990    Years since quitting: 28.9  . Smokeless tobacco: Former NeurosurgeonUser    Types: Snuff, Dorna BloomChew    Quit date: 01/09/1978  Substance and Sexual Activity  . Alcohol use: Yes    Alcohol/week: 0.0 standard drinks    Comment: 1 pint weekly  . Drug use: No  . Sexual activity: Yes    Partners: Female  Other Topics Concern  . Not on file  Social History Narrative   Married, daughter in college.     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   [place]   No smoker.  Walks 5mi one day per week.    Occasional drink of ETOH.         Social Determinants of Health   Financial Resource Strain:   . Difficulty of Paying Living Expenses: Not on file  Food Insecurity:   . Worried About Programme researcher, broadcasting/film/videounning Out of Food in the Last Year: Not on file  . Ran Out of Food in the Last Year: Not on file  Transportation Needs:   . Lack of Transportation (Medical): Not on file  . Lack of Transportation (Non-Medical): Not on file  Physical Activity:   . Days of Exercise per Week: Not on file  . Minutes of Exercise per Session: Not on file  Stress:   . Feeling of Stress : Not on file  Social Connections:   . Frequency of Communication with Friends and Family: Not on file  . Frequency of Social Gatherings with Friends and Family: Not on file  . Attends Religious Services: Not on file  . Active Member of Clubs or Organizations: Not on file  . Attends BankerClub or Organization Meetings: Not on file  . Marital Status: Not on file   Family History  Problem Relation Age of Onset  . Hypertension Father   . Diabetes Father        type 2  . Cancer Father        lymphoma  . Birth defects Sister   . Cancer Sister        breast- survivor  . Other Sister        blood clot  . Diabetes Brother        type 2  . Colon cancer Neg Hx   . Colon polyps Neg Hx   . Esophageal cancer Neg Hx   . Rectal cancer Neg Hx   . Stomach cancer Neg Hx      MEDS;   Outpatient Medications Prior to Visit  Medication Sig Dispense Refill  . Ascorbic Acid (VITAMIN C) 1000 MG tablet Take 1,000 mg by mouth daily.      Marland Kitchen. aspirin 81 MG tablet Take 162 mg by mouth daily.     . enalapril (VASOTEC) 20 MG tablet TAKE 1 TABLET BY MOUTH EVERY DAY 90 tablet 1  . esomeprazole (NEXIUM) 40 MG capsule Take 1 capsule (40 mg total) by mouth daily before breakfast. 90 capsule 1  . ibuprofen (ADVIL,MOTRIN) 800 MG tablet 1 tab po bid prn pain 180 tablet 1  . Multiple Vitamin (MULTIVITAMIN) tablet Take 1 tablet by mouth daily.      Marland Kitchen. zolpidem (AMBIEN)  10  MG tablet Take 1 tablet (10 mg total) by mouth at bedtime as needed. for sleep (Patient not taking: Reported on 12/25/2018) 30 tablet 1   No facility-administered medications prior to visit.    PE: Blood pressure 135/82, pulse 79, temperature 98.2 F (36.8 C), temperature source Temporal, resp. rate 16, height 5' 6.5" (1.689 m), weight 275 lb 3.2 oz (124.8 kg), SpO2 98 %. Body mass index is 43.75 kg/m.  Gen: Alert, well appearing.  Patient is oriented to person, place, time, and situation. AFFECT: pleasant, lucid thought and speech.  ZOX:WRUE: no injection, icteris, swelling, or exudate.  EOMI, PERRLA. Mouth: lips without lesion/swelling.  Oral mucosa pink and moist. Oropharynx without erythema, exudate, or swelling.  Neck - No masses or thyromegaly or limitation in range of motion CV: RRR,no m/r/g. Chest is clear, no wheezing or rales. Normal symmetric air entry throughout both lung fields. No chest wall deformities or tenderness. ABD: soft, NT, ND, BS normal.  No hepatospenomegaly or mass.  No bruits. EXT: no clubbing or cyanosis.  no edema.  Rectal exam: negative without mass, lesions or tenderness, PROSTATE EXAM: smooth and symmetric without nodules or tenderness Neuro: CN 2-12 intact bilaterally, strength 5/5 in proximal and distal upper extremities and lower extremities bilaterally.    No tremor.  No disdiadochokinesis.  No ataxia.  Upper extremity and lower extremity DTRs symmetric.  No pronator drift.    LABS:   Lab Results  Component Value Date   TSH 1.73 12/18/2014   Lab Results  Component Value Date   WBC 7.2 12/18/2014   HGB 16.9 12/18/2014   HCT 50.5 12/18/2014   MCV 93.9 12/18/2014   PLT 275.0 12/18/2014   Lab Results  Component Value Date   CREATININE 1.12 06/28/2017   BUN 23 06/28/2017   NA 136 06/28/2017   K 4.4 06/28/2017   CL 105 06/28/2017   CO2 20 06/28/2017   Lab Results  Component Value Date   ALT 39 12/18/2014   AST 22 12/18/2014   ALKPHOS  68 12/18/2014   BILITOT 0.5 12/18/2014   Lab Results  Component Value Date   CHOL 270 (H) 12/18/2014   Lab Results  Component Value Date   HDL 46.80 12/18/2014   Lab Results  Component Value Date   LDLCALC 195 (H) 12/18/2014   Lab Results  Component Value Date   TRIG 139.0 12/18/2014   Lab Results  Component Value Date   CHOLHDL 6 12/18/2014   Lab Results  Component Value Date   PSA 0.69 06/26/2016   PSA 0.62 12/18/2014   PSA 0.75 04/21/2013   Lab Results  Component Value Date   HGBA1C 5.9 (H) 06/28/2017    IMPRESSION AND PLAN:  1) Subacute lower abd pain/boating. Has lived in [place] most recently and does have hx of parasite infection in the past.  Early satiety, question of abnormal wt loss.  Also with intermittent polyarthralgias. Will do extensive lab eval to look for evidence of IBD, celiac dz, parasitic dz, autoimmune/rheum dz, check h pylori serology, check for alpha gal serology, CRP, stool studies, CBC w/diff, CMET, lipase, etc--see orders. UA w/micro. Empiric treatment with mebendazole at this time. May need CT abd/pelv and/or colonoscopy in near future.  2) HTN: The current medical regimen is effective;  continue present plan and medications. Lytes/cr check.  3) IFG: checking fasting glucose and Hba1c today.  4) Insomnia: doing fine off of ambien at this time.  An After Visit Summary was printed and given  to the patient.  FOLLOW UP:  2 wks  Signed:  Crissie Sickles, MD           12/25/2018

## 2018-12-25 NOTE — Telephone Encounter (Signed)
Pt's pharmacy has been updated, Rx sent for BP med enalapril.

## 2018-12-26 ENCOUNTER — Encounter: Payer: Self-pay | Admitting: Family Medicine

## 2018-12-28 LAB — TIQ-NTM

## 2018-12-28 LAB — CYCLIC CITRUL PEPTIDE ANTIBODY, IGG/IGA: Cyclic Citrullin Peptide Ab: 6 units (ref 0–19)

## 2018-12-30 ENCOUNTER — Encounter: Payer: Self-pay | Admitting: Family Medicine

## 2018-12-31 LAB — ALPHA-GAL PANEL
Beef IgE: <0.1 kU/L (ref <0.35)
Class: 0
Class: 0
Class: 0
Galactose-alpha-1,3-galactose IgE: <0.1 kU/L (ref <0.10)
LAMB/MUTTON IGE: <0.1 kU/L (ref <0.35)
Pork IgE: <0.1 kU/L (ref <0.35)

## 2018-12-31 LAB — ANA SCREEN,IFA,REFLEX TITER/PATTERN,REFLEX MPLX 11 AB CASCADE
14-3-3 eta Protein: <0.2 ng/mL (ref <0.2)
Anti Nuclear Antibody (ANA): NEGATIVE
Cyclic Citrullin Peptide Ab: <16 UNITS
Rhuematoid fact SerPl-aCnc: <14 IU/mL (ref <14)

## 2018-12-31 LAB — TISSUE TRANSGLUTAMINASE, IGA: (tTG) Ab, IgA: 1 U/mL

## 2018-12-31 LAB — HLA-B27 ANTIGEN: HLA-B27 Antigen: NEGATIVE

## 2018-12-31 LAB — HEPATITIS B SURFACE ANTIGEN: Hepatitis B Surface Ag: NONREACTIVE

## 2018-12-31 LAB — HEPATITIS C ANTIBODY
Hepatitis C Ab: NONREACTIVE
SIGNAL TO CUT-OFF: 0.01 (ref <1.00)

## 2019-01-01 LAB — OVA AND PARASITE EXAMINATION
CONCENTRATE RESULT:: NONE SEEN
MICRO NUMBER:: 1210774
SPECIMEN QUALITY:: ADEQUATE
TRICHROME RESULT:: NONE SEEN

## 2019-01-02 LAB — STOOL CULTURE
MICRO NUMBER:: 1210779
MICRO NUMBER:: 1210781
MICRO NUMBER:: 1210783
SHIGA RESULT:: NOT DETECTED
SPECIMEN QUALITY:: ADEQUATE
SPECIMEN QUALITY:: ADEQUATE
SPECIMEN QUALITY:: ADEQUATE

## 2019-01-02 LAB — GIARDIA/CRYPTOSPORIDIUM (EIA)
MICRO NUMBER:: 1210780
MICRO NUMBER:: 1210782
RESULT:: NOT DETECTED
RESULT:: NOT DETECTED
SPECIMEN QUALITY:: ADEQUATE
SPECIMEN QUALITY:: ADEQUATE

## 2019-01-06 ENCOUNTER — Encounter: Payer: Self-pay | Admitting: Family Medicine

## 2019-01-06 ENCOUNTER — Telehealth: Payer: Self-pay

## 2019-01-06 ENCOUNTER — Other Ambulatory Visit: Payer: Self-pay | Admitting: Family Medicine

## 2019-01-06 ENCOUNTER — Other Ambulatory Visit: Payer: Self-pay

## 2019-01-06 DIAGNOSIS — R6881 Early satiety: Secondary | ICD-10-CM

## 2019-01-06 DIAGNOSIS — R3129 Other microscopic hematuria: Secondary | ICD-10-CM

## 2019-01-06 DIAGNOSIS — R140 Abdominal distension (gaseous): Secondary | ICD-10-CM

## 2019-01-06 DIAGNOSIS — R1084 Generalized abdominal pain: Secondary | ICD-10-CM

## 2019-01-06 NOTE — Telephone Encounter (Signed)
Pt's wife advised of imaging location and will be called to schedule appt time by clerical staff.

## 2019-01-06 NOTE — Progress Notes (Unsigned)
Ct can

## 2019-01-06 NOTE — Telephone Encounter (Signed)
Patient's wife is requesting a call back about patient getting a CT scan. Please call.

## 2019-01-07 ENCOUNTER — Other Ambulatory Visit: Payer: Self-pay

## 2019-01-07 DIAGNOSIS — R3129 Other microscopic hematuria: Secondary | ICD-10-CM

## 2019-01-07 NOTE — Telephone Encounter (Signed)
Patient has been scheduled 01/08/19.

## 2019-01-08 ENCOUNTER — Other Ambulatory Visit: Payer: Self-pay

## 2019-01-08 ENCOUNTER — Ambulatory Visit (HOSPITAL_BASED_OUTPATIENT_CLINIC_OR_DEPARTMENT_OTHER)
Admission: RE | Admit: 2019-01-08 | Discharge: 2019-01-08 | Disposition: A | Discharge status: Home / Self Care | Payer: PRIVATE HEALTH INSURANCE | Source: Ambulatory Visit | Attending: Family Medicine | Admitting: Family Medicine

## 2019-01-08 DIAGNOSIS — R3129 Other microscopic hematuria: Secondary | ICD-10-CM | POA: Insufficient documentation

## 2019-01-08 MED ORDER — IOHEXOL 300 MG/ML  SOLN
100.0000 mL | Freq: Once | INTRAMUSCULAR | Status: AC | PRN
  Administered 2019-01-08: 16:00:00 100 mL via INTRAVENOUS

## 2019-01-09 ENCOUNTER — Ambulatory Visit (INDEPENDENT_AMBULATORY_CARE_PROVIDER_SITE_OTHER): Payer: PRIVATE HEALTH INSURANCE | Admitting: Family Medicine

## 2019-01-09 ENCOUNTER — Telehealth: Payer: Self-pay

## 2019-01-09 ENCOUNTER — Other Ambulatory Visit: Payer: Self-pay | Admitting: Family Medicine

## 2019-01-09 ENCOUNTER — Encounter: Payer: Self-pay | Admitting: Family Medicine

## 2019-01-09 ENCOUNTER — Telehealth: Payer: Self-pay | Admitting: Family Medicine

## 2019-01-09 VITALS — BP 150/79 | HR 97 | Temp 98.3°F | Resp 18 | Ht 66.5 in | Wt 279.0 lb

## 2019-01-09 DIAGNOSIS — R140 Abdominal distension (gaseous): Secondary | ICD-10-CM | POA: Diagnosis not present

## 2019-01-09 DIAGNOSIS — Z23 Encounter for immunization: Secondary | ICD-10-CM

## 2019-01-09 DIAGNOSIS — R1084 Generalized abdominal pain: Secondary | ICD-10-CM | POA: Diagnosis not present

## 2019-01-09 DIAGNOSIS — Z1211 Encounter for screening for malignant neoplasm of colon: Secondary | ICD-10-CM | POA: Diagnosis not present

## 2019-01-09 DIAGNOSIS — F5101 Primary insomnia: Secondary | ICD-10-CM

## 2019-01-09 DIAGNOSIS — R6881 Early satiety: Secondary | ICD-10-CM

## 2019-01-09 DIAGNOSIS — R3129 Other microscopic hematuria: Secondary | ICD-10-CM

## 2019-01-09 DIAGNOSIS — E7800 Pure hypercholesterolemia, unspecified: Secondary | ICD-10-CM

## 2019-01-09 DIAGNOSIS — Z125 Encounter for screening for malignant neoplasm of prostate: Secondary | ICD-10-CM | POA: Diagnosis not present

## 2019-01-09 DIAGNOSIS — R635 Abnormal weight gain: Secondary | ICD-10-CM

## 2019-01-09 DIAGNOSIS — H903 Sensorineural hearing loss, bilateral: Secondary | ICD-10-CM

## 2019-01-09 DIAGNOSIS — M722 Plantar fascial fibromatosis: Secondary | ICD-10-CM

## 2019-01-09 DIAGNOSIS — Z0000 Encounter for general adult medical examination without abnormal findings: Secondary | ICD-10-CM

## 2019-01-09 DIAGNOSIS — N529 Male erectile dysfunction, unspecified: Secondary | ICD-10-CM

## 2019-01-09 DIAGNOSIS — I10 Essential (primary) hypertension: Secondary | ICD-10-CM

## 2019-01-09 LAB — LIPID PANEL
Cholesterol: 318 mg/dL — ABNORMAL HIGH (ref 0–200)
HDL: 41.6 mg/dL (ref >39.00)
NonHDL: 276.33
Total CHOL/HDL Ratio: 8
Triglycerides: 261 mg/dL — ABNORMAL HIGH (ref 0.0–149.0)
VLDL: 52.2 mg/dL — ABNORMAL HIGH (ref 0.0–40.0)

## 2019-01-09 LAB — CORTISOL: Cortisol, Plasma: 6 ug/dL

## 2019-01-09 LAB — TSH: TSH: 1.68 u[IU]/mL (ref 0.35–4.50)

## 2019-01-09 LAB — PSA: PSA: 0.81 ng/mL (ref 0.10–4.00)

## 2019-01-09 LAB — LDL CHOLESTEROL, DIRECT: Direct LDL: 225 mg/dL

## 2019-01-09 MED ORDER — ZOLPIDEM TARTRATE 10 MG PO TABS: 10.0000 mg | ORAL_TABLET | Freq: Every evening | ORAL | 1 refills | Status: AC | PRN

## 2019-01-09 MED ORDER — SILDENAFIL CITRATE 20 MG PO TABS: ORAL_TABLET | ORAL | 3 refills | Status: DC

## 2019-01-09 MED ORDER — DUEXIS 800-26.6 MG PO TABS: 1.0000 | ORAL_TABLET | Freq: Three times a day (TID) | ORAL | 3 refills | Status: DC | PRN

## 2019-01-09 MED FILL — DUEXIS 800-26.6 MG TABLET: 800-26.6 | 30 days supply | Qty: 90 | Fill #0

## 2019-01-09 NOTE — Telephone Encounter (Signed)
Pt seen today for CPE --- Dr. Anitra Lauth gave patient a new prescription --- [facility] and requires a Prior Auth I told patient prior auth take 2- 3 days to approve.  Patient changes insurance tonight at midnight. He will be going on to [employer].  They also will probably require a prior auth. We will not start the PA process until Monday 01/13/2019.  Patient is requesting to speak with Dr. Anitra Lauth. Patient leaves country on 01/20/19. Please advise.  Patient can be reached at [phone].

## 2019-01-09 NOTE — Progress Notes (Signed)
Office Note 01/09/2019  CC:  Chief Complaint  Patient presents with  . Annual Exam    HPI:  Kyle Bryant is a 63 y.o. White male who is who is here for annual health maintenance exam AND to f/u recent subacute generalized abdominal discomfort and bloating. I did a blood and stool w/u when I saw him for this 12/25/18 and all returned normal except his A1c had gone up to 6.3% and his urine showed microhematuria. He travels in [place]/lives there some-->I chose to empirically treat with mebendazole.   CT abd/pelv was done yesterday: IMPRESSION: 1. No acute abdominal/pelvic findings, mass lesions or adenopathy. 2. Large left inguinal hernia containing fat and part of the urinary bladder. 3. Extensive colonic diverticulosis but no findings for acute diverticulitis. 4. Surgical changes from anterior abdominal wall hernia repair. Small adjacent periumbilical abdominal wall hernia containing fat. 5. Diffuse fatty infiltration of the liver.   Interim hx: Says nothing has changed from GI issues standpoint.  Took the mebendazole but no change at all.  Other issues: on/off for 1 yr but worse the last 5d, plantar pain on L foot, doesn't feel like it goes away when rests but is DEFINitely worse with standing/ambulation.  POints to medial arch area.  No ice or meds tried.  Tries some stretching.  Legs swelling like his usual, but says R was prolonged x 2 wks before going down after last plane flight a few months ago. On/off thighs and calves with painful cramping at rest sometimes and with walking as well---no difference.  Frustrated with some wt gain despite having changed to largely a diabetic type diet. Not much exercise at all.   20-25 lb gain over the last year that baffles him.  Has chronic hearing loss, feels like it is worsening and impacting his life. He is skeptical of hearing aids b/c feels like too expensive and don't work--per his friends' reports. Last hearing tested 30  +yrs ago.  + chronic excessive noise exposure.  Constant bilat ringing in ears.  BP up today: no home monitoring being done in a long time.  Compliant with his med.  Has ED, long term, libido intact, asks for rx for viagra.  Has chronic insomnia, uses ambien moderate amount of the time. PMP AWARE reviewed today: most recent rx for Remus Loffler was filled 06/28/17, # 15, rx by me. No red flags.   Past Medical History:  Diagnosis Date  . Anxiety   . Depression, major 12/2012  . DVT (deep venous thrombosis) (HCC) 1997   left leg; s/p knee surgery.  Coumadin x 57mo.  . Erectile dysfunction   . GERD (gastroesophageal reflux disease)   . Hyperlipidemia    Anal leakage/intolerance to atorva and prava: pt declines further statin trials  . Hypertension   . Insomnia   . Microhematuria 12/2018  . Prediabetes 06/2017   Gluc 130 06/2017 (pt went out of country for 6 mo after this lab), Hba1c 5.9% at that time. A1c 6.3% Dec 2020.  Marland Kitchen Shingles    ?    Past Surgical History:  Procedure Laterality Date  . CARDIOVASCULAR STRESS TEST  02/2018   in [place].  Nl except markedly exaggerated hypertensive response to exercise.  Marland Kitchen COLONOSCOPY  2006   Normal (out of state)  . HERNIA REPAIR  2006   umbillical  . ROTATOR CUFF REPAIR  1995   right shoulder  . torn menincus  1997   left knee  . VASECTOMY  2008  Family History  Problem Relation Age of Onset  . Hypertension Father   . Diabetes Father        type 2  . Cancer Father        lymphoma  . Birth defects Sister   . Cancer Sister        breast- survivor  . Other Sister        blood clot  . Diabetes Brother        type 2  . Colon cancer Neg Hx   . Colon polyps Neg Hx   . Esophageal cancer Neg Hx   . Rectal cancer Neg Hx   . Stomach cancer Neg Hx     Social History   Socioeconomic History  . Marital status: Married    Spouse name: Not on file  . Number of children: Not on file  . Years of education: Not on file  .  Highest education level: Not on file  Occupational History  . Not on file  Tobacco Use  . Smoking status: Former Smoker    Packs/day: 2.00    Types: Cigarettes, Pipe, Cigars    Quit date: 01/09/1990    Years since quitting: 29.0  . Smokeless tobacco: Former Systems developer    Types: Snuff, Sarina Ser    Quit date: 01/09/1978  Substance and Sexual Activity  . Alcohol use: Yes    Alcohol/week: 0.0 standard drinks    Comment: 1 pint weekly  . Drug use: No  . Sexual activity: Yes    Partners: Female  Other Topics Concern  . Not on file  Social History Narrative   Married, daughter in college.     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   Lived in [place] prior to [place].   No smoker.  Walks 51mi one day per week.   Occasional drink of ETOH.         Social Determinants of Health   Financial Resource Strain:   . Difficulty of Paying Living Expenses: Not on file  Food Insecurity:   . Worried About Charity fundraiser in the Last Year: Not on file  . Ran Out of Food in the Last Year: Not on file  Transportation Needs:   . Lack of Transportation (Medical): Not on file  . Lack of Transportation (Non-Medical): Not on file  Physical Activity:   . Days of Exercise per Week: Not on file  . Minutes of Exercise per Session: Not on file  Stress:   . Feeling of Stress : Not on file  Social Connections:   . Frequency of Communication with Friends and Family: Not on file  . Frequency of Social Gatherings with Friends and Family: Not on file  . Attends Religious Services: Not on file  . Active Member of Clubs or Organizations: Not on file  . Attends Archivist Meetings: Not on file  . Marital Status: Not on file  Intimate Partner Violence:   . Fear of Current or Ex-Partner: Not on file  . Emotionally Abused: Not on file  . Physically Abused: Not on file  . Sexually Abused: Not on file    Outpatient Medications Prior to Visit  Medication Sig Dispense Refill  . Ascorbic  Acid (VITAMIN C) 1000 MG tablet Take 1,000 mg by mouth daily.      Marland Kitchen aspirin 81 MG tablet Take 162 mg by mouth daily.     . enalapril (VASOTEC) 20 MG tablet TAKE 1 TABLET BY MOUTH EVERY  DAY 90 tablet 1  . esomeprazole (NEXIUM) 40 MG capsule Take 1 capsule (40 mg total) by mouth daily before breakfast. 90 capsule 1  . ibuprofen (ADVIL,MOTRIN) 800 MG tablet 1 tab po bid prn pain 180 tablet 1  . Multiple Vitamin (MULTIVITAMIN) tablet Take 1 tablet by mouth daily.      Marland Kitchen zolpidem (AMBIEN) 10 MG tablet Take 1 tablet (10 mg total) by mouth at bedtime as needed. for sleep 30 tablet 1   No facility-administered medications prior to visit.    No Known Allergies  ROS Review of Systems  Constitutional: Positive for fatigue (mild,chronic). Negative for appetite change, chills and fever.  HENT: Positive for hearing loss and tinnitus. Negative for congestion, dental problem, ear pain and sore throat.   Eyes: Negative for discharge, redness and visual disturbance.  Respiratory: Negative for cough, chest tightness, shortness of breath and wheezing.   Cardiovascular: Negative for chest pain, palpitations and leg swelling.  Gastrointestinal: Positive for abdominal distention and abdominal pain. Negative for blood in stool, diarrhea, nausea and vomiting.  Endocrine: Negative for cold intolerance, heat intolerance, polydipsia, polyphagia and polyuria.  Genitourinary: Negative for difficulty urinating, dysuria, flank pain, frequency, hematuria and urgency.  Musculoskeletal: Negative for arthralgias, back pain, joint swelling, myalgias and neck stiffness.  Skin: Negative for pallor and rash.  Neurological: Negative for dizziness, tremors, speech difficulty, weakness and headaches.  Hematological: Negative for adenopathy. Does not bruise/bleed easily.  Psychiatric/Behavioral: Negative for confusion, dysphoric mood and sleep disturbance. The patient is not nervous/anxious.     PE; Blood pressure (!) 150/79,  pulse 97, temperature 98.3 F (36.8 C), temperature source Temporal, resp. rate 18, height 5' 6.5" (1.689 m), weight 279 lb (126.6 kg), SpO2 97 %. Body mass index is 44.36 kg/m.  Gen: Alert, well appearing.  Patient is oriented to person, place, time, and situation. AFFECT: pleasant, lucid thought and speech. ENT: Ears: EACs clear, normal epithelium.  TMs with good light reflex and landmarks bilaterally.  Eyes: no injection, icteris, swelling, or exudate.  EOMI, PERRLA. Nose: no drainage or turbinate edema/swelling.  No injection or focal lesion.  Mouth: lips without lesion/swelling.  Oral mucosa pink and moist.  Dentition intact and without obvious caries or gingival swelling.  Oropharynx without erythema, exudate, or swelling.  Neck: supple/nontender.  No LAD, mass, or TM.  Carotid pulses 2+ bilaterally, without bruits. CV: RRR, no m/r/g.   LUNGS: CTA bilat, nonlabored resps, good aeration in all lung fields. ABD: soft, NT, ND, BS normal.  No hepatospenomegaly or mass.  No bruits. EXT: no clubbing, cyanosis, or edema.  Musculoskeletal: no joint swelling, erythema, warmth, or tenderness.  ROM of all joints intact. Left ankle w/out swelling, erythema, or warmth.  ROM normal.  Plantar surface TTP starting at the medial calcaneal tubercle. Skin - no sores or suspicious lesions or rashes or color changes Rectal exam: negative without mass, lesions or tenderness, PROSTATE EXAM: smooth and symmetric without nodules or tenderness.  Pertinent labs:  Lab Results  Component Value Date   TSH 1.73 12/18/2014   Lab Results  Component Value Date   WBC 8.2 12/25/2018   HGB 15.4 12/25/2018   HCT 46.1 12/25/2018   MCV 96.2 12/25/2018   PLT 305.0 12/25/2018   Lab Results  Component Value Date   CREATININE 1.23 12/25/2018   BUN 16 12/25/2018   NA 137 12/25/2018   K 4.3 12/25/2018   CL 104 12/25/2018   CO2 23 12/25/2018   Lab Results  Component  Value Date   ALT 41 12/25/2018   AST 20  12/25/2018   ALKPHOS 85 12/25/2018   BILITOT 0.4 12/25/2018   Lab Results  Component Value Date   CHOL 270 (H) 12/18/2014   Lab Results  Component Value Date   HDL 46.80 12/18/2014   Lab Results  Component Value Date   LDLCALC 195 (H) 12/18/2014   Lab Results  Component Value Date   TRIG 139.0 12/18/2014   Lab Results  Component Value Date   CHOLHDL 6 12/18/2014   Lab Results  Component Value Date   PSA 0.69 06/26/2016   PSA 0.62 12/18/2014   PSA 0.75 04/21/2013   Lab Results  Component Value Date   HGBA1C 6.3 12/25/2018   Lab Results  Component Value Date   ESRSEDRATE 25 (H) 12/25/2018   Lab Results  Component Value Date   CRP 1.7 12/25/2018   Lab Results  Component Value Date   LIPASE 32.0 12/25/2018    UA 12/25/18: micro showed 3-6 RBCs per HPF.  ASSESSMENT AND PLAN:   1) Abdominal bloating, early satiety.  Work up revealed only microhematuria. I did CT abd/pelv w/cont to look further into both of these probs. May ultimately need GI AND urology referral, depending on results of CT and depending on his clinical course.  2) Elev bp in pt with HTN: needs to restart home monitoring bp/hr qd and we'll see him back in 2 wks to discuss.  3) Plantar fasciitis, left: discussed ice and stretching.  He declined hard soled shoe. If not improving in 1 mo then will do steroid injection.  4) Microhematuria: ? Related to part of bladder being in his inguinal hernia (noted on CT). Eventually will have urology see him for consideration of cystoscopy  (prioritizing GI at this time).  5) ED: sildenafil 20mg , 1-5 qd prn, #50, RFx3. Therapeutic expectations and side effect profile of medication discussed today.  Patient's questions answered.  6) Insomnia, uses ambien in moderate amounts at most. Rx fill today.  7) Health maintenance exam: Reviewed age and gender appropriate health maintenance issues (prudent diet, regular exercise, health risks of tobacco and  excessive alcohol, use of seatbelts, fire alarms in home, use of sunscreen).  Also reviewed age and gender appropriate health screening as well as vaccine recommendations. Vaccines: Flu->given today.   Labs: PSA, FLP->pt intol of statins, but ? Try cholestyramine or welchol?). Prostate ca screening: DRE normal today , PSA. Colon ca screening: per pt his initial screening colonoscopy in 2006 was normal ('out of state").  I referred him to GI for rpt in 2018 but due to his hectic world travel for his job he was unable to schedule this procedure.-->this will likely be addressed at his GI eval for his abdominal complaints in #1 above.  An After Visit Summary was printed and given to the patient.  FOLLOW UP:  Return in about 2 weeks (around 01/23/2019) for f/u HTN.  Signed:  Santiago BumpersPhil Shirley Bolle, MD           01/09/2019

## 2019-01-09 NOTE — Telephone Encounter (Signed)
Patient checking on urology referral for hematuria.

## 2019-01-09 NOTE — Telephone Encounter (Signed)
We will do the referral for this AFTER he sees the gastroenterologist for further evaluation of his abdominal complaints. Have to put it on the "side burner" for now.-thx

## 2019-01-12 ENCOUNTER — Encounter: Payer: Self-pay | Admitting: Family Medicine

## 2019-01-13 NOTE — Telephone Encounter (Signed)
Pls reassure pt/pt's wife that everything is happening as quickly as possible.

## 2019-01-13 NOTE — Telephone Encounter (Signed)
Patient's wife aware.  Patient's wife just wanted to remind Dr Milinda Cave, he will be traveling out of country 01/21/19. Okay to speak with wife per DPR.  Please advise.

## 2019-01-14 NOTE — Telephone Encounter (Signed)
[facility] [place] and no active insurance currently showing on file after 01/09/19 when medication was sent in. Advised pt's wife, Raynelle Fanning that updated insurance needed at pharmacy to determine if PA needed or out of pocket cost. They will go to pharmacy today and complete this.

## 2019-01-16 ENCOUNTER — Telehealth: Payer: Self-pay

## 2019-01-16 ENCOUNTER — Telehealth: Payer: Self-pay | Admitting: Family Medicine

## 2019-01-16 MED ORDER — CELECOXIB 200 MG PO CAPS: 200.0000 mg | ORAL_CAPSULE | Freq: Every day | ORAL | 3 refills | Status: AC

## 2019-01-16 MED ORDER — FAMOTIDINE 40 MG PO TABS: 40.0000 mg | ORAL_TABLET | Freq: Every day | ORAL | 3 refills | Status: AC

## 2019-01-16 MED ORDER — SILDENAFIL CITRATE 20 MG PO TABS: ORAL_TABLET | ORAL | 3 refills | Status: AC

## 2019-01-16 NOTE — Telephone Encounter (Signed)
Patient was given Rx for Sildenafil on 01/09/19 to take to pharmacy. New Rx needed, can send tot pharmacy for reprocess due to pt's insurance info being updated.  Please advise, thanks.

## 2019-01-16 NOTE — Telephone Encounter (Signed)
OK, I sent sildenafil to his pharmacy in [place].

## 2019-01-16 NOTE — Telephone Encounter (Signed)
Phone note already started. Added alternative options to other note and sent to PCP.

## 2019-01-16 NOTE — Telephone Encounter (Signed)
I sent in celebrex to [facility]. I also sent in generic pepcid to take with the celebrex every time.

## 2019-01-16 NOTE — Telephone Encounter (Signed)
PA sent via covermymed on 01/16/19   Key: [id number]   Medication: DUEXIS 800-26.6mg    Dx: Back pain, M54.9   Per Dr. Milinda Cave pt has tried and failed N/A   Waiting for response.

## 2019-01-16 NOTE — Telephone Encounter (Signed)
Received fax from [facility] stating Duexis 800-26.6mg  tabs are not covered with patient's plan. PA can be done but alternative options are listed below that do not require PA: -meloxicam 15mg ,  -diclofenac sodium 75mg  TbEC,  -Ibuprofen 600mg ,  -Naproxen tab -Naproxen sodium 550mg  tab -Ketoprofen 25mg  cap -Celecoxib 200mg  cap -Fenoprofen 200mg  tab  [place]. Please advise, thanks.

## 2019-01-16 NOTE — Telephone Encounter (Signed)
Pts wife was called and notified that these medications were sent to the pharmacy that were approved by insurance. Pts wife stated she called the insurance company that his ED medication, stomach medication and the special coated IBU would need a PA. Pts wife continued to say that she could understand at first that the insurance company messed up but that we do not know what we are doing and they may just have to find a new MD if we cannot get it together. Pts wife was advised I would let Dr Milinda Cave and his assistant know information.   I do not see where anything has been said in any notes about needing a prior auth for the ED medication.

## 2019-01-16 NOTE — Telephone Encounter (Signed)
PA done and waiting for response. Patient is aware.

## 2019-01-16 NOTE — Telephone Encounter (Signed)
Patients wife calling to check on status of meds again. She has called several times regarding PA for prescribed meds from Dr. Milinda Cave. I tried to reassure her this was being addressed, and it takes time. She states that he/patient does not have time, because he leaved out of the [place]. States she will continue to call until completed.   Please call with update when PA is completed.  Patient states [facility] has done their part (??) I

## 2019-01-17 NOTE — Telephone Encounter (Signed)
PA is required. Will try completing before end of the day.

## 2019-01-20 NOTE — Telephone Encounter (Signed)
Noted. Just to comment, we ALL went above and beyond for this patient during this entire situation. The patient and his wife clearly have had unreasonable expectations during this time and I commend everyone at our office for continuing to be gracious and polite to them despite the (the patient and his wife) rude and demanding way they treated them.  I will no longer see this patient.

## 2019-01-20 NOTE — Telephone Encounter (Signed)
No response for PA yet. Pt notified and expressed frustration that "they had enough and will be looking for a new primary care physician". PCP verbally notified. More detailed message sent to PCP and office manager regarding the situation.

## 2019-01-20 NOTE — Telephone Encounter (Signed)
PA was completed on 01/16/19 and additional paperwork received 01/17/19 and faxed back. Still have not received a response for Duexis or ED med. Contacted pt to give update regarding this and pt and his wife both expressed "this was ridiculous, they have had it and will be finding another primary care physician". They thanked for me for my time. Nothing further needed. Sent as Lorain Childes

## 2019-01-30 ENCOUNTER — Encounter: Payer: Self-pay | Admitting: Family Medicine

## 2019-04-17 ENCOUNTER — Other Ambulatory Visit: Payer: Self-pay | Admitting: Family Medicine

## 2021-08-11 IMAGING — CT CT ABD-PELV W/ CM
3 of 8 series · 15 of 46 positions shown, 17 images · IV contrast (Omnipaque)
Comparison: None.

CLINICAL DATA: Abdominal pain, gas and bloating for 4 months.

EXAM:
CT ABDOMEN AND PELVIS WITH CONTRAST
TECHNIQUE: Multidetector CT imaging of the abdomen and pelvis was performed
using the standard protocol following bolus administration of
intravenous contrast.
CONTRAST:  100mL OMNIPAQUE IOHEXOL 300 MG/ML  SOLN

[Series 2: axial st · axial · 0.92mm/px · z∈[+641,+911]mm · 10 of 67 slices shown, 12 images (1 of 2)]
[im 7/67  soft-tissue]
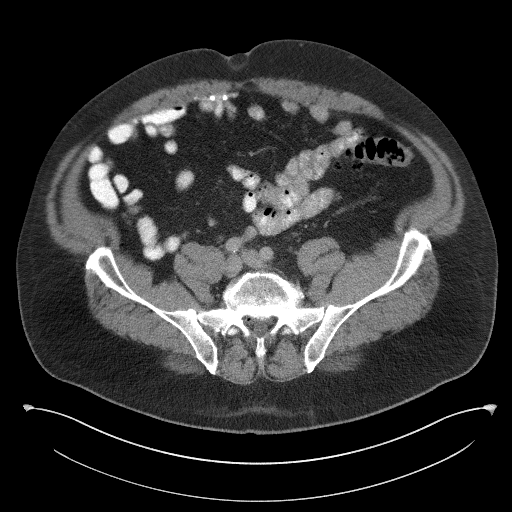
[im 7/67  bone]
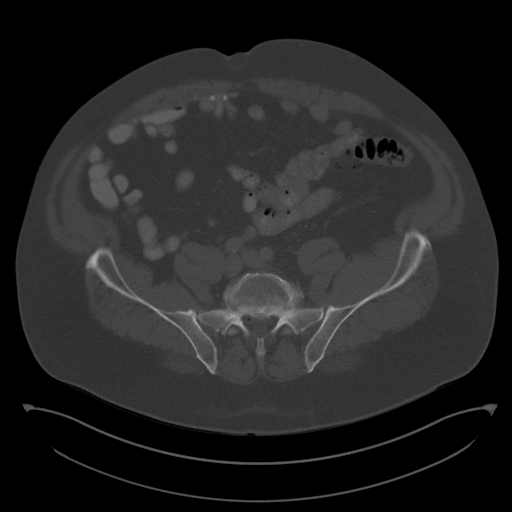
[im 13/67  soft-tissue]
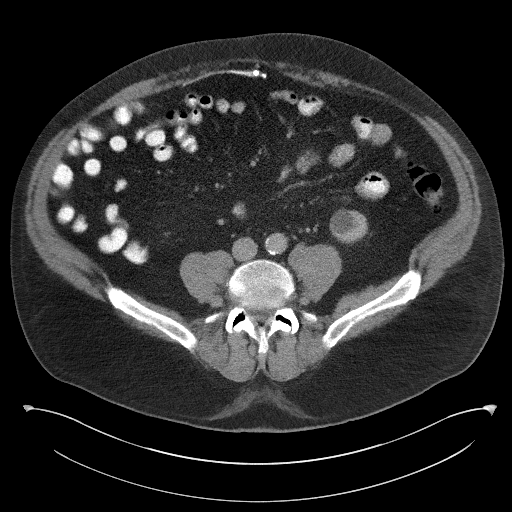
[im 19/67  soft-tissue]
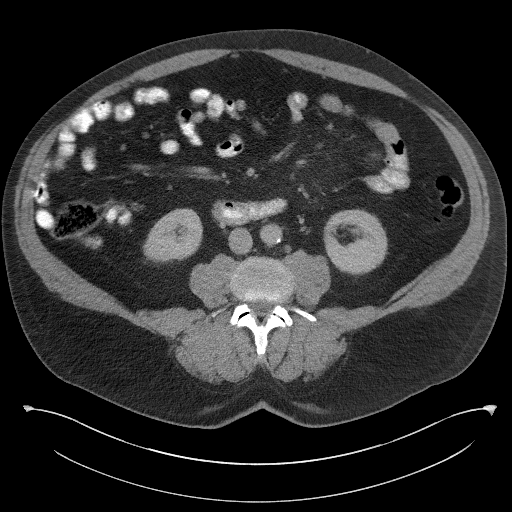
[im 25/67  soft-tissue]
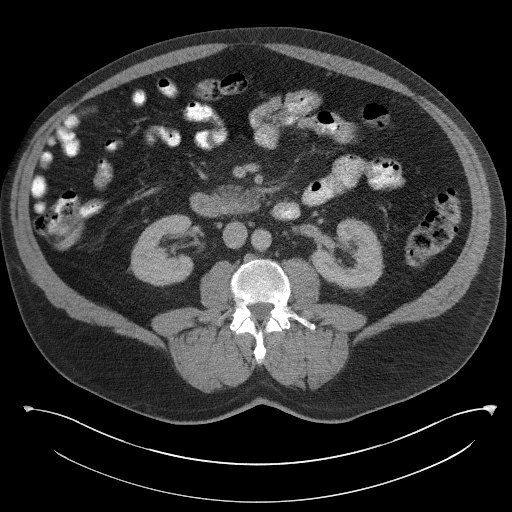
[im 31/67  soft-tissue]
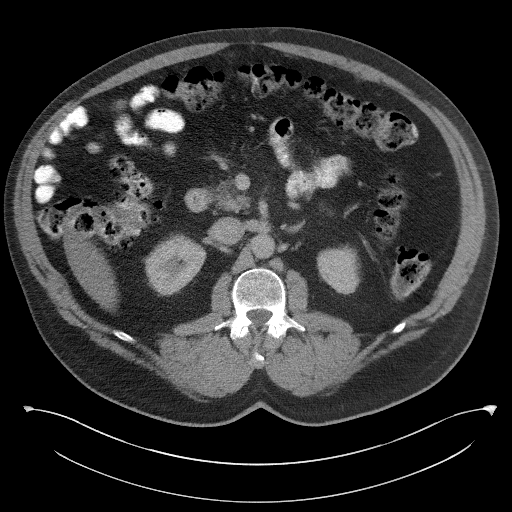
[im 37/67  soft-tissue]
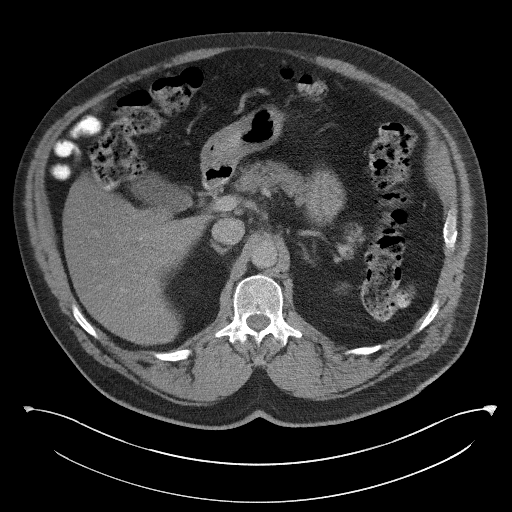
[im 43/67  soft-tissue]
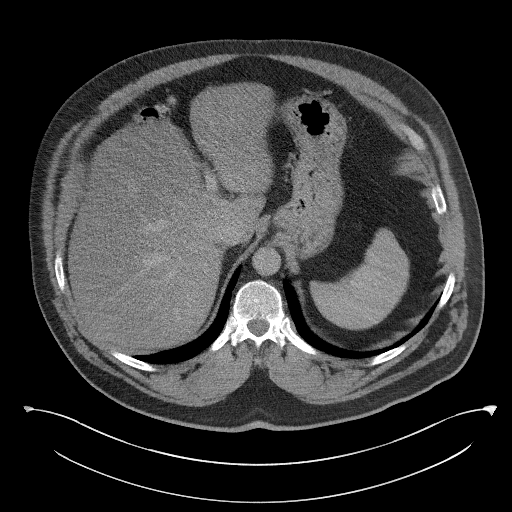
[im 49/67  soft-tissue]
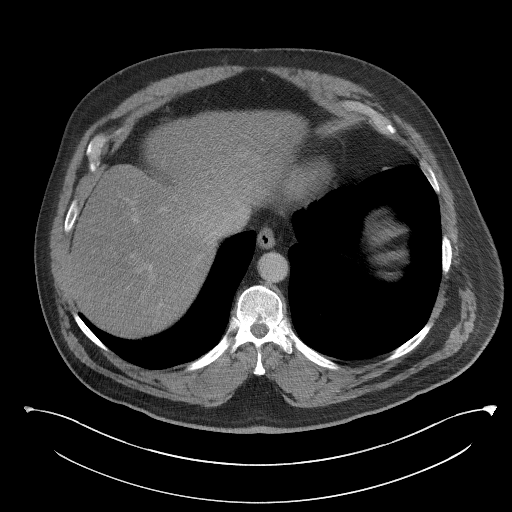
[im 55/67  soft-tissue]
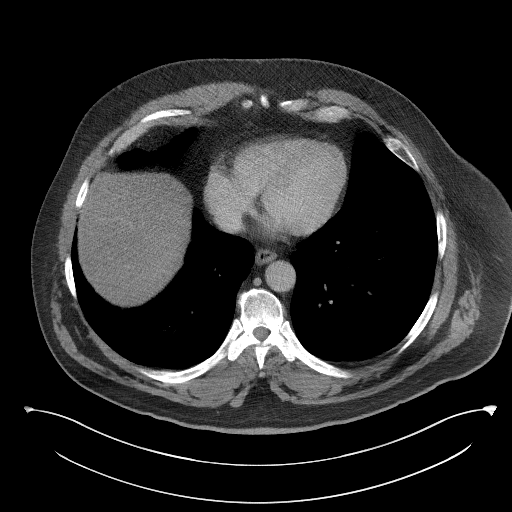
[im 55/67  bone]
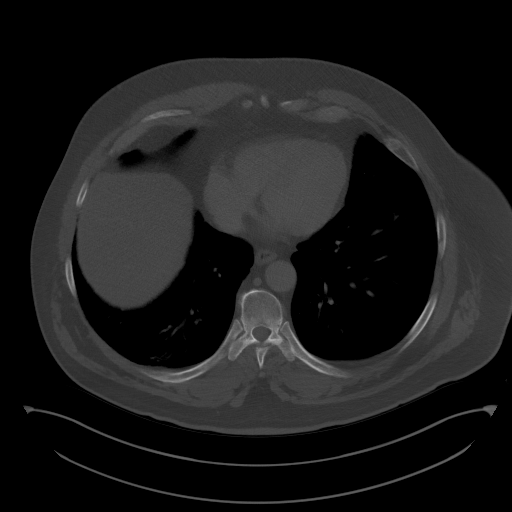
[im 61/67  soft-tissue]
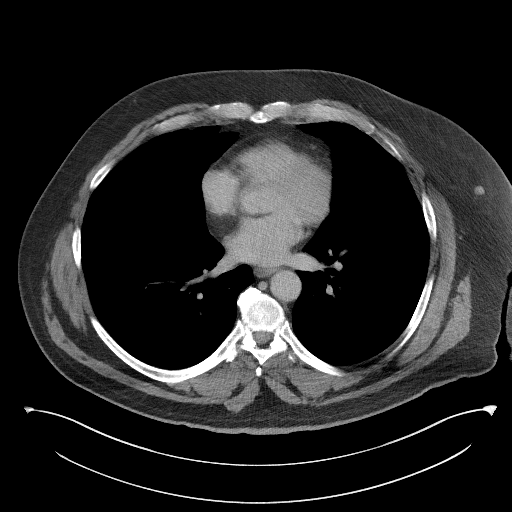

[Series 5: coronal st · coronal · 0.66mm/px · 3 of 125 slices shown]
[im 32/125  soft-tissue]
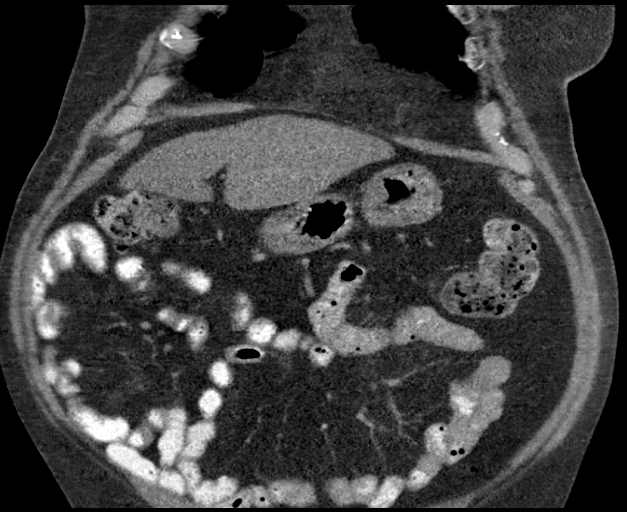
[im 63/125  soft-tissue]
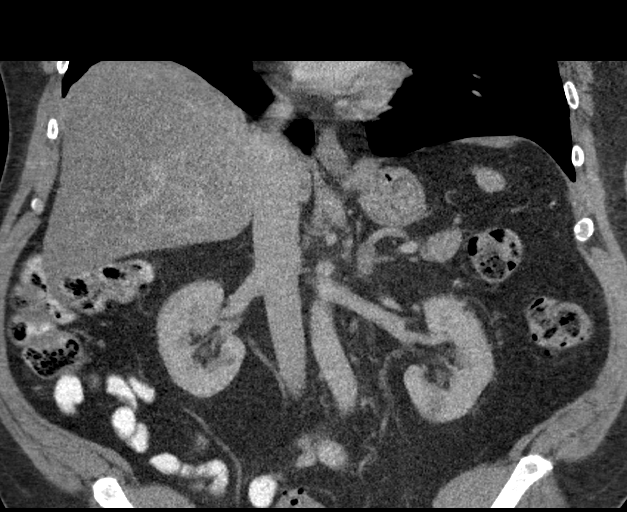
[im 94/125  soft-tissue]
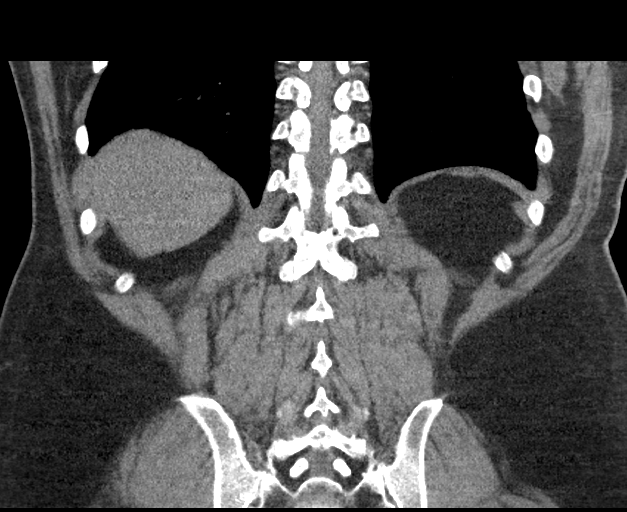

[Series 7: axial st · axial · 0.96mm/px · z∈[+472,+507]mm · 2 of 63 slices shown (2 of 2)]
[im 7/63  soft-tissue]
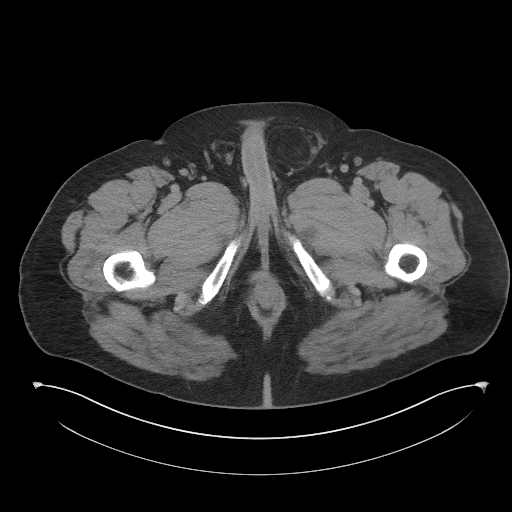
[im 14/63  soft-tissue]
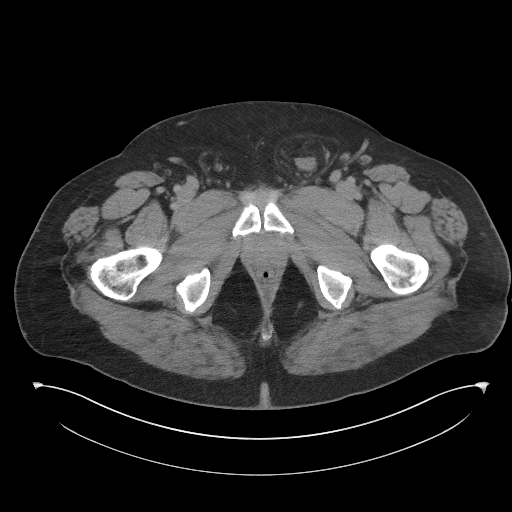

[15 of 46 positions shown; findings below may reference images not displayed]

FINDINGS: Lower chest: Streaky right basilar atelectasis or scarring. No
infiltrates or effusions. No worrisome pulmonary lesions. The heart
is normal in size. No pericardial effusion. The distal esophagus is
grossly normal.

Hepatobiliary: Diffuse fatty infiltration of the liver but no focal
hepatic lesions or intrahepatic biliary dilatation. The gallbladder
is normal. No common bile duct dilatation.

Pancreas: No mass, inflammation or ductal dilatation.

Spleen: Normal size.  No focal lesions.

Adrenals/Urinary Tract: The adrenal glands and kidneys are
unremarkable. No renal, ureteral or bladder calculi or mass. Simple
appearing cyst projecting off the lower pole region of the left
kidney. The delayed images do not demonstrate any significant
collecting system abnormalities.

No bladder wall thickening or bladder mass. However, the left dome
region of the bladder projects into a left inguinal hernia.

Stomach/Bowel: The stomach, duodenum, small bowel and colon are
unremarkable. No acute inflammatory changes, mass lesions or
obstructive findings. The terminal ileum is normal. The appendix is
normal. There is diffuse colonic diverticulosis most notable
involving the descending colon and sigmoid colon but no findings for
acute diverticulitis.

Vascular/Lymphatic: The aorta is normal in caliber. No dissection.
The branch vessels are patent. The major venous structures are
patent. No mesenteric or retroperitoneal mass or adenopathy. Small
scattered lymph nodes are noted.

Reproductive: The prostate gland and seminal vesicles are
unremarkable.

Other: Evidence of prior anterior abdominal wall hernia surgery with
mesh. There is a small periumbilical abdominal wall hernia
containing fat.

Musculoskeletal: No significant bony findings. Advanced degenerative
disc disease noted at L5-S1 and moderate lower lumbar facet disease.
IMPRESSION: 1. No acute abdominal/pelvic findings, mass lesions or adenopathy.
2. Large left inguinal hernia containing fat and part of the urinary
bladder.
3. Extensive colonic diverticulosis but no findings for acute
diverticulitis.
4. Surgical changes from anterior abdominal wall hernia repair.
Small adjacent periumbilical abdominal wall hernia containing fat.
5. Diffuse fatty infiltration of the liver.
